# Patient Record
Sex: Male | Born: 1950 | Race: White | Hispanic: No | Marital: Married
Health system: Northeastern US, Academic
[De-identification: ages and names within clinical notes are randomized; demographics above are authoritative.]

---

## 2015-09-29 ENCOUNTER — Ambulatory Visit

## 2015-09-29 NOTE — Progress Notes (Signed)
* * *        **  Dale Reynolds    --- ---    33 Y old Male, DOB: 05-10-1950    [address] [place], [place], [place] [address]    Home: [phone]    Provider: Cherlyn Cushing        * * *    Telephone Encounter    ---    Answered by   Demetrios Loll  Date: 09/29/2015         Time: 04:28 PM    Reason   SG pt transition    --- ---            Message                      called no answer LVMTCB and reschedule.                Action Taken   Rodriguez,Jennifer 09/29/2015 4:28:39 PM > Rodriguez,Jennifer  10/02/2015 11:11:07 AM > 2nd attempt Rodriguez,Jennifer 10/03/2015 11:31:11 AM >  called again no answer should we send out a letter? Belleville,Lynne 10/03/2015  11:58:08 AM > pls send letter Tan,Molin 10/03/2015 1:29:23 PM > appt changed,  mailed out with letter                * * *                ---          * * *          Patient: Dale Reynolds DOB: March 05, 1951 Provider: Cherlyn Cushing  09/29/2015    ---    Note generated by eClinicalWorks EMR/PM Software ([email or web address])

## 2015-11-01 ENCOUNTER — Ambulatory Visit: Admitting: Urology

## 2015-11-01 NOTE — Progress Notes (Signed)
* * *        **  Dale Reynolds    --- ---    81 Y old Male, DOB: 1950-12-28    [address] [place], [place], [place] [address]    Home: [phone]    Provider: Aletta Edouard        * * *    Telephone Encounter    ---    Answered by   Jeani Hawking  Date: 11/01/2015         Time: 11:11 AM    Reason   new ordering md    --- ---            Action Taken   [facility] 11/01/2015 11:15:11 AM > new order in                * * *              * * *        ---        Reason for Appointment    ---      1\. New ordering md    ---      Current Medications    ---      None    ---      Past Medical History    ---      HTN    ---    Mitchondrial myopathy    ---    Nephrolithiasis    ---    BPH - Hypertrophy (benign) of prostate without urinary obstruction and other  lower urinary tract symptoms [LUTS]    ---    Kidney Stone - Calculus of kidney    ---    Hematuria, unspecified    ---    Elevated prostate specific antigen (PSA)    ---    Elevated prostate specific antigen (PSA)    ---    HTN (hypertension)    ---    Mitochondrial myopathy    ---      Assessments    ---    1\. Kidney stones - N20.0 (Primary)    ---      Treatment    ---       **1\. Kidney stones**    CT Abdomen and Pelvis C- C-ZYS*[id number]    ---          * * *          Patient: Dale Reynolds DOB: [birth year] Provider: Aletta Edouard 11/01/2015    ---    Note generated by eClinicalWorks EMR/PM Software ([email or web address])

## 2015-12-07 ENCOUNTER — Ambulatory Visit: Admitting: Ophthalmology

## 2015-12-07 NOTE — Op Note (Signed)
Patient    Xavien, Dauphinais              Med Rec #:  [id number]  Name:  Operation  12/07/2015                Pt.  Dt:                                  Location:  .  Marland Kitchen                               OPERATIVE REPORT  .  Marland Kitchen  PREOPERATIVE DIAGNOSIS:  Full thickness macular hole, left eye.  Marland Kitchen  POSTOPERATIVE DIAGNOSES:  Full thickness macular hole, left eye and retinal  tear, left eye.  Marland Kitchen  NAME OF OPERATION:  A 25-gauge pars plana vitrectomy, ICG assisted membrane  peeling, cryotherapy, 25% SF6.  Marland Kitchen  ATTENDING SURGEON:  Kandra Nicolas, M.D.  .  ASSISTANT SURGEON:  Odie Sera, M.D.  Please note that Dr. Odie Sera, was the first assistant, as no properly trained surgical resident  was available for the procedure.  .  ANESTHESIA:  Retrobulbar block with monitored anesthesia care.  .  COMPLICATIONS:  None.  .  ESTIMATED BLOOD LOSS:  None.  .  SPECIMENS:  None.  .  INDICATIONS FOR PROCEDURE:  This is a 65 year old male with decreased  vision secondary to macular hole.  Visual acuity prior to surgery was  20/200.  Risks, benefits, and alternatives of surgery were discussed at  length and all questions were answered.  The patient elected to proceed  with surgery and informed consent was obtained.  .  DESCRIPTION OF PROCEDURE:  The patient was brought to the preoperative  area.  The operative eye was marked as the correct surgical site and  premedicated with a standard regimen of dilating drops.  The patient was  brought to the operating room.  The operative eye was confirmed to be the  correct eye with the indirect ophthalmoscope and the nonoperative eye was  shielded.  Timeout was performed and a retrobulbar block was administered  to the operative eye in a standard fashion without complication.  The  patient was prepped and draped in the usual sterile fashion for ophthalmic  surgery.  An eyelid speculum was placed.  Three 25-gauge trocars were  inserted 4 mm posteriorly from the limbus inferotemporally,  supratemporally, and  supranasally.  The infusion line was secured at the  infratemporal cannula and turned on after the tip was verified to be in the  vitreous cavity.  Light pipe and vitrector were placed in the eye.  Intraoperative examination was significant for a folded and macular hole.  A core pars plana vitrectomy was performed.  A PVD was induced.  The  vitreous was cut for 360 degrees.  The ICG dye was used in the fluid filled  eye to stain the ILM.  All the ICG dye was removed.  Thereafter, the  Charles lens was used to visualize the macula.  The forceps were used to  create an edge and peel the internal limiting membrane of the macula.  At  this point, the periphery was inspected for 360 degrees and there were 2  areas of concern, both treated with cryotherapy, the first was a horseshoe  retinal tear at 11 o'clock and the second was a circular  retinal hole at 12  o'clock.  At this point, a complete air-fluid exchange was performed.  A  25% SF6 gas was then exchanged for air.  All the instruments were removed,  all the cannulas were removed and each wound was inspected and found to be  watertight.  The speculum was removed and drapes were removed.  Antibiotic  ointment was placed over the eye followed by double patch and shield.  The  patient was taken to recovery in good condition and will be seen in the  next following day in the [facility].  The attending surgeon, Dr. Kandra Nicolas was present during the entire procedure.  .  .  .  Electronically Signed  Kandra Nicolas, MD 12/08/2015 12:25 P  .  .  .  .  Dictated by: Su Monks, MD  .  D:    12/07/2015  T:    12/07/2015 04:29 P  Dictation ID:  [id number]/Doc#  [id number]  .  cc:  .  Marland Kitchen      Document is preliminary until electronically or manually signed by                             attending physician.

## 2015-12-08 ENCOUNTER — Ambulatory Visit (HOSPITAL_BASED_OUTPATIENT_CLINIC_OR_DEPARTMENT_OTHER)

## 2015-12-08 ENCOUNTER — Ambulatory Visit: Admitting: Ophthalmology

## 2015-12-14 ENCOUNTER — Ambulatory Visit

## 2015-12-14 ENCOUNTER — Ambulatory Visit: Admitting: Urology

## 2015-12-15 ENCOUNTER — Ambulatory Visit: Admitting: Internal Medicine

## 2015-12-15 ENCOUNTER — Ambulatory Visit (HOSPITAL_BASED_OUTPATIENT_CLINIC_OR_DEPARTMENT_OTHER)

## 2015-12-20 ENCOUNTER — Ambulatory Visit: Admitting: Urology

## 2015-12-20 NOTE — Progress Notes (Signed)
* * *        [name]    --- ---    46 Y old Male, DOB: 11/17/1950    Account Number: [id number]    [address] [name], [id number]    Home: [phone]    Guarantor: [name] Insurance: [employer] PPO Payer ID: [id number]    PCP: Rudene Anda, MD    Appointment Facility: [name]        * * *    12/20/2015  Progress Notes: [name]. [name], M.D.    --- ---    ---        Current Medications    ---    Taking     * Lisinopril Tablet 1 tablet Once a day    ---    * Flomax 0.4 MG Capsule 1 capsule 30 minutes after the same meal each day Once a day    ---    * Medication List reviewed and reconciled with the patient    ---      Past Medical History    ---      HTN    ---    Mitchondrial myopathy    ---    Nephrolithiasis    ---    BPH - Hypertrophy (benign) of prostate without urinary obstruction and other  lower urinary tract symptoms [LUTS]    ---    Hematuria, unspecified    ---    Elevated prostate specific antigen (PSA)    ---      Surgical History    ---      arthroscopic knee surgery    ---    appendectomy    ---    left eye surgery 12/07/15    ---    cystoscopy: c/w BPH 08/18/2012    ---      Family History    ---      Father: deceased, ALS    ---    Mother: deceased, diagnosed with Mental Illness    ---    Children: alive    ---    Siblings: alive    ---    1 brother(s) , 2 sister(s) - healthy. 2 son(s) .    ---    no prostate cancer in the family.    ---      Social History    ---    Alcohol: yes, social, weekends.    no Smoking status: former smoker quit: @2005 , Patient counseled on the dangers  of tobacco use: 12/20/2015.    Occupation: drives a truck.    no Recreational drug use.    Caffeine: yes, frequency:, coffee, tea, soda, 1-2 cups/day.      Allergies    ---      N.K.D.A.    ---      Review of Systems    ---     _General_ :    no Fatigue. no Fever. no Weakness.    _ENT_ :    no Blurred vision. no Epistaxis. no Ringing in ears. no Sinus pain.     _Endocrine_ :    no Excessive thirst. no dry mouth. no Thyroid disease. no Diabetes.    _Cardiovascular_ :    no High blood pressure. no Hyperlipidemia. no Atrial fibrillation. no Heart  valve disease. no Coronary disease. no Carotid Artery disease. no Peripheral  Artery disease. no Chest pain. no Palpitations.    _Pulmonary_ :    no cough.  no shortness of breath. no Lung disease (COPD), Asthma.    _Gastroenterology_ :    no Colitis, Crohn's or Ulcerative. no Blood in stool. no Constipation. no  Diarrhea.    _HematologyLymph_ :    no Bleeding disorders. no Easy bruising. no Swollen glands.    _Male Reproductive_ :    Sexually active yes. no Difficulty with erection. no Diminished sexual drive.    _Musculoskeletal_ :    no Back pain. no Joint pain. no bone pain. no Spinal or disk problems.    _Integumentary_ :    no Persistent itching. no Skin rash.    _Urology_ :    Kidney stones or cyst yes. no Abnormal Kidney function. no Blood in urine. no  Frequent urination. no Urinary incontinence.    _Neurology_ :    no Neuropathy. no Headache. no Insomnia. no Memory loss.    _Psychology_ :    no Anxiety. no Depression. no High stress level.            Reason for Appointment    ---      1\. Benign prostatic hyperplasia (BPH)    ---    2\. elevated Prostate specific antigen (PSA)    ---    3\. Kidney stones    ---      History of Present Illness    ---     _Urological History_ :    pt here with a Hx of BPH, an elevated PSA and kidney stones: A recent CT scan  showed a couple of punctate stones: pt is voiding well with the flomax; good  flow, good control, nocturia x 0-1; no hematuria, no dsyuria,.      Vital Signs    ---    Ht 6'0", Wt 199, BMI 26.99, BP 128/64, Temp 98.4.      Examination    ---     _General_ :    Lungs/Respirations: No labored breathing, good excursion. Musculoskeletal:  normal gait.    _Genitourinary - Male_ :    General appearance: alert, oriented, build: average, no apparent distress,  pleasant.  Abdomen: soft, non-tender, no mass, no cvat, non-distended.  Anus/Perineum: anus was intact, no lesions, no rashes, no fissures/fistulas.  Digital Rectal Exam (DRE): normal, no masses. Epididymis: normal, non-tender,  bilateral. Penis normal, no rashes. Prostate smooth, 1+, non-tender, no  nodules. Scrotum: normal, no rashes, no lesions. Sphincter tone: normal.  Testicles: not enlarged. non-tender, normal bilaterally. Urethral meatus:  normal, no discharge. HEENT: Head: normocephalic, Head: atraumatic, Sclera:  anicteric. Lymphatic: no inguinal adenopathy noted. Skin: good turgor.  Neurological: no focal sensory deficits, no focal motor deficits.          Assessments    ---    1\. Benign non-nodular prostatic hyperplasia with lower urinary tract symptoms  - N40.1 (Primary)    ---    2\. Elevated PSA - R97.2    ---    3\. Kidney stones - N20.0    ---    4\. Hematuria, microscopic - R31.2    ---      Treatment    ---       **1\. Benign non-nodular prostatic hyperplasia with lower urinary tract  symptoms**    Notes: He is voiding well and will stay on the Flomax and he knows to call me  if this changes.    ---        **2\. Elevated PSA**    Notes: His PSA is in the range that it  has been and unless it starts going up,  I would hold off on a biopsy for now.        **3\. Kidney stones**    Notes: His CT showed a couple of punctate stones bilaterally and thus there is  nothing to do except follow him and I would not get a CT yearly, but maybe an  [place] every other year to follow these unless there is a clinical issue. He knows  to hydrate well.        **4\. Hematuria, microscopic**    Notes: this is within normal limits today.        **5\. Others**    Continue Flomax Capsule, 0.4 MG, 1 capsule 30 minutes after the same meal each  day, Orally, Once a day    Notes: Patient Educated with: BPH.pdf (BPH.pdf).        Labs    --- ---    [name]: Urine Culture, Routine_    ---       colony count  0       --- --- --- ---    sent to ref  lab  no       --- --- --- ---         Sherlene Shams 12/21/2015 2:27:33 PM > This lab was reviewed by  [name] on 12/24/2015 at 22:04 PM EDT    --- ---    _Lab: ClinitekStatus SG_       Color  YELLOW     \-    --- --- --- ---    GLU  Negative     \-    --- --- --- ---    BIL  Negative     \-    --- --- --- ---    KET  Negative     \-    --- --- --- ---    SG  1.020     \-    --- --- --- ---    BLO  Large     \-    --- --- --- ---    pH  5.5     \-    --- --- --- ---    PRO  Negative     \-    --- --- --- ---    URO  0.2 E.U./dL     \-    --- --- --- ---    NIT  Negative     \-    --- --- --- ---    LEU  Trace     \-    --- --- --- ---         Marcaurelle,Krystle 12/20/2015 4:20:37 PM > Ishmeal Rorie E 12/20/2015  4:36:14 PM >    --- ---    [name]: Urine Microscopy_       WBC  0       --- --- --- ---    RBC  0-2       --- --- --- ---    Epithelial  0       --- --- --- ---    Bacteria  0       --- --- --- ---    Amorphous  trace       --- --- --- ---    Mucous  0       --- --- --- ---    Casts  0       --- --- --- ---    Other  0       --- --- --- ---  Marcaurelle,Krystle 2016/01/13 4:44:29 PM > Gerlean Cid E 01-13-16  4:45:17 PM > Shayona Hibbitts E January 13, 2016 4:45:25 PM >    --- ---    [name]: PSA, total_ 4.6 ng/ml       level  4.6 ng/ml       --- --- --- ---         Sherlene Shams 2016/01/13 4:44:07 PM > Cassandre Oleksy E 01/13/16  4:45:34 PM >    --- ---      Preventive Medicine    ---      Counseling: Stone Prevention Diet Counseled on increased fluid intake,  decreased salt intake 01/13/2016.    ---      Procedure Codes    ---      81003 UA Automated    ---    47829 UCCC    ---    56213 MICROSCOPIC EXAM OF URINE    ---    08657 PSA    ---      Follow Up    ---    1 Year    Electronically signed by [name] , MD on 12/24/2015 at 09:11 PM EDT    Sign off status: Completed        * * [facility]    [address]    [place], [place] [address]    Tel:  [phone]    Fax: [phone]              * * *          Patient: Lennex, Pietila DOB: 13-Dec-1950 Progress Note: [name]. [name], M.D.  January 13, 2016    ---    Note generated by eClinicalWorks EMR/PM Software ([email or web address])

## 2016-03-26 ENCOUNTER — Ambulatory Visit (HOSPITAL_BASED_OUTPATIENT_CLINIC_OR_DEPARTMENT_OTHER)

## 2016-12-27 ENCOUNTER — Ambulatory Visit: Admitting: Urology

## 2016-12-27 NOTE — Progress Notes (Signed)
* * *        **  Dale Reynolds    --- ---    45 Y old Male, DOB: 1951-04-04    [address] [place], [place], [place] [address]    Home: [phone]    Provider: Aletta Edouard        * * *    Telephone Encounter    ---    Answered by   Aletta Edouard  Date: 12/27/2016         Time: 12:21 PM    Action Taken   Catricia Scheerer E 12/27/2016 12:21:37 PM > he needs an appt: not  urgent Kawa,Diane 12/30/2016 9:19:43 AM > rescheduled appt to 9/14 spoke to pt    --- ---                * * *                ---          * * *          Patient: Dale Reynolds DOB: 06-Nov-1950 Provider: Aletta Edouard 12/27/2016    ---    Note generated by eClinicalWorks EMR/PM Software ([email or web address])

## 2016-12-27 NOTE — Progress Notes (Signed)
* * *        **  [name]    --- ---    32 Y old Male, DOB: Dec 22, 1950    Account Number: [id number]    [address] [name], [id number]    Home: [phone]    Guarantor: [name] Insurance: [employer] PPO Payer ID: [id number]    PCP: Rudene Anda, MD    Appointment Facility: [facility].        * * *    12/27/2016  Progress Notes: [name]. [name], M.D.    --- ---    ---        Current Medications    ---    Taking     * Lisinopril Tablet 1 tablet Orally Once a day    ---    * Flomax 0.4 MG Capsule 1 capsule 30 minutes after the same meal each day Orally Once a day    ---    * Tamsulosin HCl 0.4MG  Capsule TAKE ONE CAPSULE BY MOUTH 30 MINS AFTER THE SAME MEAL     ---      Past Medical History    ---       HTN.        ---    Mitchondrial myopathy.        ---    Nephrolithiasis.        ---    BPH - Hypertrophy (benign) of prostate without urinary obstruction and other  lower urinary tract symptoms [LUTS].        ---    Hematuria, unspecified.        ---    Elevated prostate specific antigen (PSA).        ---        Reason for Appointment    ---      1\. 1 yr follow up    ---    2\. Benign prostatic hyperplasia (BPH)    ---    3\. elevated Prostate specific antigen (PSA)    ---    4\. Kidney stones    ---      Treatment    ---       **1\. Others**    Notes: pt did not show.    ---    Electronically signed by [name] , MD on 12/27/2016 at 12:21 PM EDT    Sign off status: Completed        * * [facility]    [address]    [place], [place] [id number]    Tel: [phone]    Fax: [phone]              * * *          Patient: Windle, Huebert DOB: 06/19/1950 Progress Note: [name]. [name], M.D.  12/27/2016    ---    Note generated by eClinicalWorks EMR/PM Software ([email or web address])

## 2017-01-03 ENCOUNTER — Ambulatory Visit: Admitting: Urology

## 2017-01-03 NOTE — Progress Notes (Signed)
* * *        [name]    --- ---    58 Y old Male, DOB: 07-22-1950    Account Number: [id number]    [address] [name], [id number]    Home: [phone]    Guarantor: [name] Insurance: [employer] PPO Payer ID: [id number]    PCP: Rudene Anda, MD    Appointment Facility: [facility], [facility].        * * *    01/03/2017  Progress Notes: [name]. [name], M.D.    --- ---    ---        Current Medications    ---    Taking     * Lisinopril Tablet 1 tablet Orally Once a day    ---    * Flomax 0.4 MG Capsule 1 capsule 30 minutes after the same meal each day Orally Once a day    ---    * Tamsulosin HCl 0.4MG  Capsule TAKE ONE CAPSULE BY MOUTH 30 MINS AFTER THE SAME MEAL     ---      Past Medical History    ---       HTN.        ---    Mitchondrial myopathy.        ---    Nephrolithiasis.        ---    BPH - Hypertrophy (benign) of prostate without urinary obstruction and other  lower urinary tract symptoms [LUTS].        ---    Hematuria, unspecified.        ---    Elevated prostate specific antigen (PSA).        ---      Surgical History    ---      arthroscopic knee surgery    ---    appendectomy    ---    left eye surgery 12/07/15    ---    cystoscopy: c/w BPH 08/18/2012    ---      Family History    ---      Father: deceased, ALS    ---    Mother: deceased, diagnosed with Mental Illness    ---    Children: alive    ---    Siblings: alive    ---    1 brother(s) , 2 sister(s) - healthy. 2 son(s) .    ---    no prostate cancer in the family.    ---      Social History    ---    Alcohol: yes, social, weekends.    no Smoking status: former smoker quit: @2005 , Patient counseled on the dangers  of tobacco use: 01/03/2017.    Occupation: drives a truck.    no Recreational drug use.    Caffeine: yes, frequency:, coffee, tea, soda, 1-2 cups/day.      Allergies    ---      N.K.D.A.    ---      Review of Systems    ---     _General_ :    no Fever. no Chills. no Weakness. no Abdominal pain.     _Cardiovascular_ :    no Chest pain.    _Gastroenterology_ :    no Constipation. no Diarrhea.    _Urology_ :    no Blood in urine. no Frequent urination. no Urinary incontinence.  Reason for Appointment    ---      1\. Benign prostatic hyperplasia (BPH)    ---    2\. elevated Prostate specific antigen (PSA)    ---    3\. Kidney stones    ---      History of Present Illness    ---     _Urological History_ :    pt is voiding well: good flow, good control, nocturia x 0-1; no hematuria, no  dysuria: on the Flomax.      Vital Signs    ---    Ht 6'0", Wt 196.4, BMI 26.63, BP 122/60, Temp 98.4.      Examination    ---     _General_ :    Lungs/Respirations: No labored breathing, good excursion. Musculoskeletal:  normal gait.    _Genitourinary - Male_ :    General appearance: alert, oriented, build: average, no apparent distress,  pleasant. Abdomen: soft, non-tender, no mass, no cvat, non-distended.  Anus/Perineum: anus was intact, no lesions, no rashes, no fissures/fistulas.  Digital Rectal Exam (DRE): normal, no masses. Epididymis: normal, non-tender,  bilateral. Penis normal, no rashes. Prostate smooth, normal, non-tender,  slightly enlarged. Scrotum: no rashes, no lesions, normal. Sphincter tone:  normal. Testicles: not enlarged. non-tender, normal bilaterally. Urethral  meatus: normal, no discharge. HEENT: Head: normocephalic, Head: atraumatic,  Sclera: anicteric. Lymphatic: no inguinal adenopathy noted. Skin: good turgor.  Neurological: no focal sensory deficits, no focal motor deficits.          Assessments    ---    1\. Benign non-nodular prostatic hyperplasia with lower urinary tract symptoms  - N40.1 (Primary)    ---    2\. Kidney stones - N20.0    ---    3\. Elevated PSA - R97.2    ---      Treatment    ---       **1\. Benign non-nodular prostatic hyperplasia with lower urinary tract  symptoms**    Notes: he is voiding well on the Flomax and will continue and knows to call me  if his voiding changes.     ---        **2\. Kidney stones**    Notes: given his last CT will observe and consider a renal US in the future.  He knows to hydrate well.        **3\. Elevated PSA**    Notes: His PSA is stable so will continue to observe.        **4\. Others**    Continue Tamsulosin HCl Capsule, 0.4MG , TAKE ONE CAPSULE BY MOUTH 30 MINS  AFTER THE SAME MEAL    Notes: Patient Educated with: BPH.pdf (BPH.pdf).        Labs    --- ---    [name]: Urine Culture, Routine_    ---    _Lab: ClinitekStatus SG_       GLU  Negative     \-    --- --- --- ---    BIL  Negative     \-    --- --- --- ---    KET  Negative     \-    --- --- --- ---    SG  1.010     \-    --- --- --- ---    BLO  Moderate     \-    --- --- --- ---    pH  5.5     \-    --- --- --- ---  PRO  Negative     \-    --- --- --- ---    URO  0.2 E.U./dL     \-    --- --- --- ---    NIT  Negative     \-    --- --- --- ---    LEU  Trace     \-    --- --- --- ---         Sherlene Shams January 11, 2017 1:57:33 PM > Ski Polich E 2017/01/11  2:18:39 PM >    --- ---    [name]: Urine Microscopy_       WBC  0-1       --- --- --- ---    RBC  0-1       --- --- --- ---    Epithelial  0       --- --- --- ---    Bacteria  0       --- --- --- ---    Amorphous  trace       --- --- --- ---    Mucous  0       --- --- --- ---    Casts  0       --- --- --- ---    Other  0       --- --- --- ---         Sherlene Shams 01-11-2017 2:23:27 PM > Leeana Creer E Jan 11, 2017  2:28:06 PM >    --- ---    [name]: PSA, total_ 5.1 ng/ml       level  5.1 ng/ml       --- --- --- ---         Sherlene Shams 2017-01-11 2:22:54 PM > Jaremy Nosal E 2017/01/11  2:25:14 PM >    --- ---      Preventive Medicine    ---      Counseling: Stone Prevention Diet Counseled on increased fluid intake,  decreased salt intake January 11, 2017.    ---      Procedure Codes    ---      81003 UA Automated    ---    46962 UCCC    ---    95284 MICROSCOPIC EXAM OF URINE    ---    13244 PSA    ---      Follow Up    ---    1 Year     Electronically signed by [name] , MD on 01-11-2017 at 06:44 PM EDT    Sign off status: Completed        * * [facility]    [address]    [place], [place] [id number]    Tel: [phone]    Fax: [phone]              * * *          Patient: Burdette, Forehand DOB: 1950-06-19 Progress Note: [name]. [name], M.D.  2017-01-11    ---    Note generated by eClinicalWorks EMR/PM Software ([email or web address])

## 2017-01-06 ENCOUNTER — Ambulatory Visit

## 2017-01-08 ENCOUNTER — Ambulatory Visit: Admitting: Urology

## 2017-01-08 NOTE — Progress Notes (Signed)
* * *        Dale Reynolds    --- ---    10 Y old Male, DOB: [birth year]    [address] [place], [place], [place] [address]    Home: [phone]    Provider: Aletta Edouard        * * *    Telephone Encounter    ---    Answered by   Jaynie Crumble  Date: 01/08/2017         Time: 09:32 AM    Caller   pt    --- ---            Reason   Ct complete- f/u okay? Please advise            Message                      left side  thinks it his kidney stone  acting up   since yesterday    please all him @  [phone]                Action Taken   Kawa,Diane 01/08/2017 9:33:34 AM > Leahy,Nancy 01/08/2017 9:43:16  AM > no answer @ [phone] Leahy,Nancy 01/08/2017 11:30:24 AM > pt states  pain is on left- 11/2015 - RA said if stones acted up get an [place] - ok to order  Cohen,Matthew A 01/08/2017 11:42:57 AM > yes renal US thanks Leahy,Nancy  01/08/2017 12:09:15 PM > order is in- pt is off today until Friday - soonest  they can do for him - call pt with time and date Kawa,Diane 01/08/2017 12:38:19  PM > scheduled ultrasound @ [facility] 9/20 1:30 spoke to pt he is aware and will be  there Leahy,Nancy 01/08/2017 12:58:19 PM > pt has no stones and mild bilateral  hydro - they recommend a CT - maybe ask RE if we should order or let pt know  we will discuss with RA monday if not - lm at [phone] to call or call him  with update...thanks Edelstein,Robert A 01/10/2017 12:37:46 PM > OK to discuss  with Dr. [name] on Monday. THanks, RE [facility] 01/10/2017 1:43:42 PM >  Pt looking for [place] results Makinley Muscato E 01/12/2017 5:17:50 PM > we can get a  urolith CT scan [facility] 01/13/2017 10:22:08 AM > CT order is in = please  call pt and book Kawa,Diane 01/13/2017 11:10:10 AM > scheculed 9/27 @ 10:30 @  [facility] pt is aware Leahy,Nancy 01/13/2017 11:29:30 AM > can you get his  results and send this msg and results to myself /Larissa when ready  Phakonekham,Somchay 01/17/2017 8:07:15 AM > CT scan is in pt's doc  Winchester,Larissa 01/17/2017  11:24:06 AM > ct in pt docs- small punctuate  stones- had f/u 11/9- please let me lknow if he needs sooner, ty  Wallie Lagrand E 01/17/2017 11:29:33 AM > I called him this AM: see tel  encounter.Marland Kitchen all set [facility] 01/17/2017 11:47:05 AM > noted                * * *              * * *        ---        Reason for Appointment    ---      1\. Sever pain    ---         **Medical History:**   HTN.    ---  Mitchondrial myopathy.    ---    Nephrolithiasis.    ---    BPH - Hypertrophy (benign) of prostate without urinary obstruction and other  lower urinary tract symptoms [LUTS].    ---    Hematuria, unspecified.    ---    Elevated prostate specific antigen (PSA).    ---      Assessments    ---    1\. Kidney stones - N20.0 (Primary)    ---    2\. Acute left flank pain - R10.9    ---      Treatment    ---       **1\. Kidney stones**    _IMAGING: CT Abdomen and Pelvis Prone C- e-sch*_     scheduled at [facility] on 01/16/17 10:30 am Prior Auth # [id number]  (02/11/17) Kawa,Diane 01/13/2017 11:09:30 AM > Takeru Bose E 01/16/2017  6:25:47 PM >    --- ---    Ardine Bjork: [place] Retroperitoneal Complete e-sch*_   scheduled at [facility] on 01/09/17 1:30 pm MUST DRINK 32oz WATER ONE HOUR BEFORE AND HOLD  Kawa,Diane 01/08/2017 12:38:50 PM > Hildagarde Holleran E 01/13/2017 4:07:59 PM >    --- ---         **2\. Acute left flank pain**    _IMAGING: CT Abdomen and Pelvis Prone C- e-sch*_     scheduled at [facility] on 01/16/17 10:30 am Prior Auth # [id number]  (02/11/17) Kawa,Diane 01/13/2017 11:09:30 AM > Nakiyah Beverley E 01/16/2017  6:25:47 PM >    --- ---    Ardine Bjork: [place] Retroperitoneal Complete e-sch*_   scheduled at [facility] on 01/09/17 1:30 pm MUST DRINK 32oz WATER ONE HOUR BEFORE AND HOLD  Kawa,Diane 01/08/2017 12:38:50 PM > Donte Kary E 01/13/2017 4:07:59 PM >    --- ---          * * *          Patient: Dale Reynolds DOB: Sep 11, 1950 Provider: Aletta Edouard  01/08/2017    ---    Note generated by eClinicalWorks EMR/PM Software ([email or web address])

## 2017-01-09 ENCOUNTER — Ambulatory Visit: Admitting: Urology

## 2017-01-16 ENCOUNTER — Ambulatory Visit: Admitting: Urology

## 2017-01-16 ENCOUNTER — Ambulatory Visit

## 2017-01-17 ENCOUNTER — Ambulatory Visit: Admitting: Urology

## 2017-01-17 NOTE — Progress Notes (Signed)
* * *        **  [name]    --- ---    69 Y old Male, DOB: [birth year]    [address] [place], [place], [place] [address]    Home: [phone]    Provider: Aletta Edouard        * * *    Telephone Encounter    ---    Answered by   Aletta Edouard  Date: 01/17/2017         Time: 08:46 AM    Action Taken   Isobella Ascher E 01/17/2017 8:46:20 AM > I called the pt about  his CT scan. he is doing better but still with a little pain on /off so I will  see him in a month to make sure this small stone passed instead of being at  the UVJ. He knows to call us if his pain increases. : Appt made    --- ---                * * *                ---          * * *          Patient: Dale Reynolds, Dale Reynolds DOB: 1950-12-20 Provider: Aletta Edouard 01/17/2017    ---    Note generated by eClinicalWorks EMR/PM Software ([email or web address])

## 2017-02-28 ENCOUNTER — Ambulatory Visit

## 2017-02-28 ENCOUNTER — Ambulatory Visit: Admitting: Urology

## 2017-02-28 NOTE — Progress Notes (Signed)
* * *        Dale Reynolds    --- ---    26 Y old Male, DOB: 14-Dec-1950    Account Number: [id number]    [address] [name], [place]    Home: [phone]    Guarantor: Dale Reynolds Insurance: [employer] P[employer] Payer ID: [id number]    PCP: Rudene Anda, MD    Appointment Facility: [facility].        * * *    02/28/2017  Progress Notes: [name]. [name], M.D.    --- ---    ---        Current Medications    ---    Taking     * Lisinopril Tablet 1 tablet Orally Once a day    ---    * Flomax 0.4 MG Capsule 1 capsule 30 minutes after the same meal each day Orally Once a day    ---    * Tamsulosin HCl 0.4MG  Capsule TAKE ONE CAPSULE BY MOUTH 30 MINS AFTER THE SAME MEAL     ---      Past Medical History    ---       HTN.        ---    Mitchondrial myopathy.        ---    Nephrolithiasis.        ---    BPH - Hypertrophy (benign) of prostate without urinary obstruction and other  lower urinary tract symptoms [LUTS].        ---    Hematuria, unspecified.        ---    Elevated prostate specific antigen (PSA).        ---      Surgical History    ---      arthroscopic knee surgery    ---    appendectomy    ---    left eye surgery 12/07/15    ---    cystoscopy: c/w BPH 08/18/2012    ---      Family History    ---      Father: deceased, ALS    ---    Mother: deceased, diagnosed with Mental Illness    ---    Children: alive    ---    Siblings: alive    ---    1 brother(s) , 2 sister(s) - healthy. 2 son(s) .    ---    no prostate cancer in the family.    ---      Social History    ---    Alcohol: yes, social, weekends.    no Smoking status: former smoker quit: @2005 , Patient counseled on the dangers  of tobacco use: 02/28/2017.    Occupation: drives a truck.    no Recreational drug use.    Caffeine: yes, frequency:, coffee, tea, soda, 1-2 cups/day.      Allergies    ---      N.K.D.A.    ---      Hospitalization/Major Diagnostic Procedure    ---      No Hospitalization History.    ---       Review of Systems    ---     _General_ :    no Fever. no Weakness. no Abdominal pain. no Weight gain.    _Gastroenterology_ :    no Constipation. no Diarrhea.    _Musculoskeletal_ :    Back pain yes.  _Urology_ :    no Blood in urine. no Frequent urination. no Urinary incontinence.            Reason for Appointment    ---      1\. Kidney stones    ---      History of Present Illness    ---     _Urological History_ :    pt is here after a recent CT showed only small stones: but mild left  hydroureter with a recent small bladder stone. pt still has some left flank to  the middle of the back: and down, and some in rarely in the front; no voiding  S&S; at all; no gross hematuria, no dysuria. pain in the back relieved by  stretching out.. uncomfortable; advil helps:.      Examination    ---     _General_ :    Lungs/Respirations: good excursion, No labored breathing. Musculoskeletal:  normal gait.    _Genitourinary - Male_ :    General appearance: alert, oriented, build: average, no apparent distress,  pleasant. Abdomen: soft, non-tender, no mass, normal, non-distended, no cvat,  pt points to his mid lower back. Digital Rectal Exam (DRE): deferred.  Epididymis: normal, non-tender, bilateral. Penis no rashes, normal. Scrotum:  normal, no rashes, no lesions. Testicles: normal bilaterally, not enlarged.  non-tender. Urethral meatus: no discharge, normal. Lymphatic: no inguinal  adenopathy noted.          Assessments    ---    1\. Kidney stones - N20.0 (Primary)    ---    2\. Midline low back pain, unspecified chronicity, with sciatica presence  unspecified - M54.5    ---      Treatment    ---       **1\. Kidney stones**    _IMAGING: [place] Retroperitoneal Complete e-sch*_     see last CT scan: ? left  hydro and UVJ stone due to some eprsistent left back pain Booked @ [facility] 03/07/17 @ 1045AM Full Bladder 32oz Water 1Hr Prior Pre Reg  [phone] [name],[name] 02/28/2017 11:05:00 AM > faxed and gave hand order     --- ---        Notes: He probably passed his small stone but since the last CT scan showed  the stone possible at the UVJ as opposed to the bladder itself, I want to get  a follow up US on him to check on this. His S&S; are more c/w with actual back  pain and not stone discomfort or colic. I explained this to him using  pictures.        **2\. Midline low back pain, unspecified chronicity, with sciatica presence  unspecified**    Notes: I think this is back pain and if it gets worse to discuss with his PCP.        Labs    --- ---    [name]: ClinitekStatus SG_    ---       Color  yellow/clear     \-    --- --- --- ---    GLU  Negative     \-    --- --- --- ---    BIL  Negative     \-    --- --- --- ---    KET  Negative     \-    --- --- --- ---    SG  1.025     \-    --- --- --- ---  BLO  Large     \-    --- --- --- ---    pH  5.5     \-    --- --- --- ---    PRO  Negative     \-    --- --- --- ---    URO  0.2 E.U./dL     \-    --- --- --- ---    NIT  Negative     \-    --- --- --- ---    LEU  Negative     \-    --- --- --- ---         [name] 03-14-17 10:09:38 AM > [name] 2017-03-14 10:37:21 AM  >    --- ---    [name]: Urine Microscopy_       WBC  0       --- --- --- ---    RBC  0-1       --- --- --- ---    Epithelial  0       --- --- --- ---    Bacteria  0       --- --- --- ---    Amorphous  Trace       --- --- --- ---    Mucous  0       --- --- --- ---    Casts  0       --- --- --- ---    Other  0       --- --- --- ---         [name],[name] 2017/03/14 10:42:35 AM > [name] March 14, 2017 10:48:37 AM  >    --- ---      Preventive Medicine    ---      Counseling: Stone Prevention Diet Counseled on increased fluid intake,  decreased salt intake 14-Mar-2017.    ---      Procedure Codes    ---      29562 UA Automated    ---    13086 MICROSCOPIC EXAM OF URINE    ---      Follow Up    ---    as scheduled    Electronically signed by [name] , MD on 03/01/2017 at 07:50 AM EST    Sign off status: Completed         * * [facility]    [address]    [place], [place] [id number]    Tel: [phone]    Fax: [phone]              * * *          Patient: Heyward, Douthit DOB: 1950-07-04 Progress Note: [name]. [name], M.D.  2017/03/14    ---    Note generated by eClinicalWorks EMR/PM Software ([email or web address])

## 2017-03-07 ENCOUNTER — Ambulatory Visit: Admitting: Urology

## 2017-03-11 ENCOUNTER — Ambulatory Visit: Admitting: Urology

## 2017-03-11 NOTE — Progress Notes (Signed)
* * *        **  Dale Reynolds    --- ---    65 Y old Male, DOB: [birth year]    [address] [place], [place], [place] [address]    Home: [phone]    Provider: Aletta Edouard        * * *    Telephone Encounter    ---    Answered by   Aletta Edouard  Date: 03/11/2017         Time: 08:42 AM    Action Taken   Evanne Matsunaga E 03/11/2017 8:42:22 AM > I called the pt and  left him a message to call back about his [place] [name] E 03/12/2017  7:53:26 AM > I called again and left a specific message about his [place] and told  him to call me if he was having any clincial issues.    --- ---                * * *                ---          * * *          Patient: Dale Reynolds, Dale Reynolds DOB: 1950-07-17 Provider: Aletta Edouard 03/11/2017    ---    Note generated by eClinicalWorks EMR/PM Software ([email or web address])

## 2017-03-12 ENCOUNTER — Ambulatory Visit: Admitting: Urology

## 2017-03-12 NOTE — Progress Notes (Signed)
* * *        **  Dale Reynolds    --- ---    5 Y old Male, DOB: 06-15-1950    [address] [place], [place], [place] [address]    Home: [phone]    Provider: Aletta Edouard        * * *    Telephone Encounter    ---    Answered by   Alford Highland  Date: 03/12/2017         Time: 08:13 AM    Caller   self    --- ---            Reason   returning call            Message                      Patient had a few questions regarding your message - he can be reached on his cell [phone]                Action Taken   Hooton,Deborah 03/12/2017 8:13:18 AM > Adriaan Maltese E  03/12/2017 8:48:14 AM > I called him and we got cut off.. will try later  Lafe Clerk E 03/12/2017 8:52:35 AM > I spoke with him: he still has his  mid lower back pain that appears to be non GU in nature with no renal colic or  LUTS for now so will observe but he knows to call me with a change in pain or  voiding.                * * *                ---          * * *          Patient: Dale Reynolds, Dale Reynolds DOB: 04-14-1951 Provider: Aletta Edouard 03/12/2017    ---    Note generated by eClinicalWorks EMR/PM Software ([email or web address])

## 2017-03-23 ENCOUNTER — Ambulatory Visit: Admitting: Cardiovascular Disease

## 2017-03-23 NOTE — Progress Notes (Signed)
* * *        **  [name]**    --- ---    71 Y old Male, DOB: 09-Feb-1951    [address] [place] [address]    Home: [phone]    Provider: Mitchel Honour, MD        * * *    Web Encounter    ---    Answered by   Dorna Mai  Date: 03/23/2017         Time: 06:25 PM    Caller   [name]    --- ---            Reason   Update Demographics - Personal Info            Message                      Please Update Demographic Information                Action Taken                      [facility]  03/24/2017 12:17:22 PM > updated                    * * *                ---          * * *          Patient: KENAN, MOODIE D DOB: 1950-07-24 Provider: Mitchel Honour, MD  03/23/2017    ---    Note generated by eClinicalWorks EMR/PM Software ([email or web address])

## 2017-03-23 NOTE — Progress Notes (Signed)
* * *        **  Dale Reynolds**    --- ---    56 Y old Male, DOB: 10-17-1950    [address] [place] [address]    Home: [phone]    Provider: Mitchel Honour, MD        * * *    Web Encounter    ---    Answered by   Dorna Mai  Date: 03/23/2017         Time: 06:37 PM    Caller   [name]    --- ---            Reason   Update Demographics - Additional Info            Message                      Please Update Demographic Information                Action Taken                      [facility]  03/24/2017 12:17:39 PM > updated                    * * *                ---          * * *          Patient: Dale Reynolds, Dale Reynolds DOB: Sep 19, 1950 Provider: Mitchel Honour, MD  03/23/2017    ---    Note generated by eClinicalWorks EMR/PM Software ([email or web address])

## 2017-04-21 ENCOUNTER — Ambulatory Visit: Admitting: Urology

## 2017-07-10 ENCOUNTER — Ambulatory Visit

## 2017-08-07 ENCOUNTER — Ambulatory Visit: Admitting: Urology

## 2017-08-07 NOTE — Progress Notes (Signed)
**  PROGRESS NOTES**    ---    **Patient:** Dale Reynolds, Dale Reynolds     **Account Number:** [id number]   **Provider:** Shawntina Diffee E. [name], M.D.     **DOB:** 10-13-1950 **Age:** 29 Y **Sex:** Male   **Date:** 08/07/2017     **Phone:** [phone]     **Address:** [address] [place], [place], [id number]     **Pcp:** Rudene Anda, MD        * * *        **Subjective:**        ---       **Chief Complaints:**    --- ---         --- ---      **Medical History:**        --- ---        **Objective:**        ---         **Assessment:**        ---         **Plan:**        ---         --- ---          **Provider:** Sharie Amorin E. [name], M.D.    ---     **Patient:** Dale Reynolds, Dale Reynolds **DOB:** April 18, 1951 **Date:** 08/07/2017    ---    Electronically signed by Jeani Hawking on 08/07/2017 at 04:20 PM EDT    Sign off status: Completed

## 2017-08-15 ENCOUNTER — Ambulatory Visit: Admitting: Student in an Organized Health Care Education/Training Program

## 2017-08-15 LAB — HX BASIC METABOLIC PANEL
CASE NUMBER: 2019116000703
HX ANION GAP: 5 (ref 3.0–11.0)
HX BUN: 22 mg/dL (ref 8.0–23.0)
HX CALCIUM LVL: 8.7 mg/dL (ref 8.5–10.5)
HX CHLORIDE: 110 mmol/L (ref 98.0–110.0)
HX CO2: 29 mmol/L (ref 21.0–32.0)
HX CREATININE: 1.07 mg/dL (ref 0.55–1.3)
HX GLUCOSE LVL: 95 mg/dL (ref 70.0–110.0)
HX POTASSIUM LVL: 4.3 mmol/L (ref 3.6–5.2)
HX SODIUM LVL: 144 mmol/L (ref 136.0–146.0)

## 2017-08-15 LAB — HX CBC W/ INDICES
CASE NUMBER: 2019116000703
HX ABSOLUTE NRBC COUNT: 0 10*3/uL
HX HCT: 40.4 % (ref 39.0–53.0)
HX HGB: 13.4 g/dL (ref 13.0–17.5)
HX MCH: 31.9 pg (ref 26.0–34.0)
HX MCHC: 33.2 g/dL (ref 31.0–37.0)
HX MCV: 96.2 fL (ref 80.0–100.0)
HX MPV: 9.1 fL — ABNORMAL LOW (ref 9.4–12.4)
HX NRBC PERCENT: 0 %
HX PLATELET: 203 10*3/uL (ref 150.0–400.0)
HX RBC: 4.2 10*6/uL (ref 4.2–5.9)
HX RDW-CV: 13.6 % (ref 11.5–14.5)
HX RDW-SD: 48.5 fL (ref 35.0–51.0)
HX WBC: 6 10*3/uL (ref 4.0–11.0)

## 2017-08-15 LAB — HX GLOMERULAR FILTRATION RATE (ESTIMATED)
CASE NUMBER: 2019116000703
HX AFN AMER GLOMERULAR FILTRATION RATE: 82 mL/min/{1.73_m2}
HX NON-AFN AMER GLOMERULAR FILTRATION RATE: 71 mL/min/{1.73_m2}

## 2017-08-15 LAB — HX PSC EKG LAB: CASE NUMBER: 2019116000703

## 2017-08-20 ENCOUNTER — Ambulatory Visit

## 2017-08-20 NOTE — Procedures (Signed)
Pre-op Diagnosis    biliary colic    gallbladder adenomyomatosis    Post-op Diagnosis    biliary colic    gallbladder adenomyomatosis    Procedures    Date Procedure Modifier Comments    08/20/17 Cholecystectomy Laparoscopic  N/A      Case Attendees    Attendee Role    Judie Petit MD, Ardis Rowan. Anesthesiologist of Record    Orvan Falconer MD, [facility] Primary Surgeon    Dalpe RN, BSN, Evert Kohl First Assistant      Anesthesia Type    General    Margo Aye CRNA, Huntley Dec (Provider)    Estimated Blood Loss    7.0 mL    Transfusions    Transfusions    No qualifying data available.    Catheters, Tubes, Drains    Catheters, Drains, and Tubes    No qualifying data available.    Operative Implants    *** No implants in past 24 hours ***    Tourniquet    Operative Specimens    Date/Time Obtained Specimen Description Frozen Section Tests Requested    08/20/17 12:38:00 EDT gallbladder and contents No Pathology Tissue Exam  Request          Operative Fluids    - Parenteral in ml    - Drains in ml    - Urine Output in ml    Findings    normal appearing gallbladder    Complications    none    Discharge Status    pacu    home    OR Disposition    stable.        ------        SIGNATURE LINE Electronically signed by Orvan Falconer MD, Jadis Pitter on 08/20/2017 at  13:05:59 EST

## 2017-08-20 NOTE — H&P (Signed)
Health Status    I have seen and examined the patient and reviewed the current H and P        ---        SIGNATURE LINE Electronically signed by Orvan Falconer MD, Niaomi Cartaya on 08/20/2017 at  12:15:46 EST

## 2017-08-22 LAB — HX SURG PATH FINAL REPORT
CASE NUMBER: 0
HX NOTE: 88304

## 2018-01-02 ENCOUNTER — Ambulatory Visit: Admitting: Urology

## 2018-01-06 ENCOUNTER — Ambulatory Visit

## 2018-01-09 ENCOUNTER — Ambulatory Visit: Admitting: Urology

## 2018-01-09 NOTE — Progress Notes (Signed)
* * *        Dale Reynolds    --- ---    75 Y old Male, DOB: 1951/02/08    Account Number: [id number]    [address] [name], [place]    Home: [phone]    Guarantor: Dale Reynolds Insurance: [employer] Payer ID: [id number]    PCP: Rudene Anda, MD    Appointment Facility: [facility], [facility]        * * *    01/09/2018  Progress Notes: [name]. [name], M.D.    --- ---    ---         **Current Medications**    ---    Taking     * Lisinopril Tablet 1 tablet Orally Once a day    ---    * Flomax 0.4 MG Capsule 1 capsule 30 minutes after the same meal each day Orally Once a day    ---    * Tamsulosin HCl 0.4 MG Capsule TAKE ONE CAPSULE BY MOUTH AFTER THE SAME MEAL     ---    * Medication List reviewed and reconciled with the patient    ---      Past Medical History    ---       HTN.        ---    Mitchondrial myopathy.        ---    Nephrolithiasis.        ---    BPH - Hypertrophy (benign) of prostate without urinary obstruction and other  lower urinary tract symptoms [LUTS].        ---    Hematuria, unspecified.        ---    Elevated prostate specific antigen (PSA).        ---       **Surgical History**    ---       arthroscopic knee surgery    ---    appendectomy    ---    left eye surgery 12/07/15    ---    cystoscopy: c/w BPH 08/18/2012    ---    cholecystectomy 06/2017    ---       **Family History**    ---       Father: deceased, ALS    ---    Mother: deceased, diagnosed with Mental Illness    ---    Children: alive    ---    Siblings: alive    ---    1 brother(s) , 2 sister(s) - healthy. 2 son(s) .    ---    no prostate cancer in the family.    ---       **Social History**    ---    Alcohol: yes, social, weekends.    Caffeine: yes, frequency:, coffee, tea, soda, 1-2 cups/day.    no Smoking status: former smoker quit: @2005 , Patient counseled on the dangers  of tobacco use: 01/09/2018.    Occupation: drives a truck.    no Recreational drug use.      **Allergies**     ---       N.K.D.A.    ---       **Hospitalization/Major Diagnostic Procedure**    ---       No Hospitalization History.    ---       **Review of Systems**    ---     _General_ :  no flank pain. no Weakness. no Abdominal pain. no Weight gain.    _Cardiovascular_ :    no Chest pain.    _Gastroenterology_ :    no Constipation. no Diarrhea.    _Urology_ :    no Dysuria. no Blood in urine. no Frequent urination. no Urinary incontinence.            **Reason for Appointment**    ---       1\. 1 yr follow up    ---    2\. Kidney stones    ---    3\. Benign prostatic hyperplasia (BPH)    ---    4\. elevated Prostate specific antigen (PSA)    ---       **History of Present Illness**    ---     _Urological History_ :    Pt is voiding well on the flomax, good emptying, good control, good flow,  nocturia x 0-1; no dysuria, no hematuria, no flank pain.       **Vital Signs**    ---    Ht 6'0", Wt 185, BMI 25.09, BP 128/60, Temp 98.4.       **Examination**    ---     _General_ :    Lungs/Respirations:  No labored breathing, good excursion. Musculoskeletal:  normal gait.    _Genitourinary - Male_ :    General appearance:  alert, oriented, build: average, no apparent distress,  pleasant. Abdomen:  soft, non-tender, no mass, non-distended, no cvat.  Anus/Perineum:  anus was intact, no lesions, no rashes, no fissures/fistulas.  Digital Rectal Exam (DRE):  normal, no masses. Epididymis:  normal, non-  tender, bilateral. Penis  no rashes, normal. Prostate  smooth, normal, non-  tender, slightly enlarged, non-nodular. Scrotum:  no rashes, no lesions,  normal. Sphincter tone:  normal. Testicles:  not enlarged. non-tender, normal  bilaterally. Urethral meatus:  normal, no discharge. HEENT:  Head:  normocephalic, Head: atraumatic, Sclera: anicteric. Lymphatic:  no inguinal  adenopathy noted. Skin:  good turgor.          **Assessments**    ---    1\. Benign non-nodular prostatic hyperplasia with lower urinary tract symptoms  - N40.1     ---    2\. Kidney stones - N20.0    ---    3\. Elevated PSA - R97.2    ---       **Treatment**    ---       **1\. Benign non-nodular prostatic hyperplasia with lower urinary tract  symptoms**    Notes: He is voiding well so will continue the Flomax and he knows to call me  with any changes in voiding..    ---        **2\. Kidney stones**    Notes: Not a clinical issue at this time.        **3\. Elevated PSA**    Notes: We discussed this in length and discussed about the  reasons/risks/benefits of doing a TRUS/Prostate Bx vs. rechecking this in 4  months. because he has been up and down in the past and given his age, will  recheck this but he understands that if it remains high then he needs a  prostate Bx. .        **4\. Others**    Continue Flomax Capsule, 0.4 MG, 1 capsule 30 minutes after the same meal each  day, Orally, Once a day         **Labs**    --- ---  _Lab: ClinitekStatus SG_    ---       GLU  Negative     \-    --- --- --- ---    BIL  Negative     \-    --- --- --- ---    KET  Negative     \-    --- --- --- ---    SG  1.020     \-    --- --- --- ---    BLO  2+     \-    --- --- --- ---    pH  5.5     \-    --- --- --- ---    PRO  Negative     \-    --- --- --- ---    URO  0.2 E.U./dL     \-    --- --- --- ---    NIT  Negative     \-    --- --- --- ---    LEU  Negative     \-    --- --- --- ---         Marcaurelle,Krystle 01/09/2018 1:14:53 PM > Llana Deshazo E 01/09/2018  1:17:17 PM >    --- ---    [name]: Urine Microscopy_       WBC  0       --- --- --- ---    RBC  0-1       --- --- --- ---    Epithelial  0       --- --- --- ---    Bacteria  0       --- --- --- ---    Amorphous  trace       --- --- --- ---    Mucous  0       --- --- --- ---    Casts  0       --- --- --- ---    Other  0       --- --- --- ---         Marcaurelle,Krystle 01/09/2018 1:30:00 PM > Merwyn Hodapp E 01/09/2018  1:36:13 PM >    --- ---    [name]: PSA, total_ 7.4 ng/ml       level  7.4 ng/ml       --- --- --- ---          Sherlene Shams 01/09/2018 1:29:05 PM > Reta Norgren E 01/09/2018  1:31:32 PM >    --- ---        Procedure Orders:    --- ---    _Procedure: BLADDER SCAN_    ---         Phakonekham,Somchay 01/09/2018 1:27:30 PM > PVR approx 22cc    --- ---       **Preventive Medicine**    ---       Counseling: Stone Prevention Diet Counseled on increased fluid intake,  decreased salt intake 01/09/2018.    ---      **Procedure Codes**    ---       81003 UA Automated    ---    81015 MICROSCOPIC EXAM OF URINE    ---    [address] BLADDER SCAN    ---    60454 PSA    ---       **Follow Up**    ---    4 Months    Electronically signed by [name] , MD on 01/09/2018 at 08:48 PM  EDT    Sign off status: Completed        * * [facility]    [address]    [place], [place] [id number]    Tel: [phone]    Fax: [phone]              * * *          Patient: Dale, Reynolds DOB: [birth year] Progress Note: [name]. [name], M.D.  01/09/2018    ---    Note generated by eClinicalWorks EMR/PM Software ([email or web address])

## 2018-05-15 ENCOUNTER — Ambulatory Visit

## 2018-05-15 ENCOUNTER — Ambulatory Visit: Admitting: Urology

## 2018-05-15 NOTE — Progress Notes (Signed)
 * * *      Dale Reynolds**    ------    75 Y old Male, DOB: 1950-06-10    Account Number: [id number]    [address] [name], [place]    Home: [phone]    Guarantor: Dale Reynolds Insurance: [facility] Payer ID: [id number]    PCP: Rudene Anda, MD    Appointment Facility: [facility], [facility].        * * *    05/15/2018 Progress Notes: [name]. [name], M.D.    ------    ---       **Current Medications**    ---    Taking    * Lisinopril Tablet 1 tablet Orally Once a day    ---    * Tamsulosin HCl 0.4 MG Capsule TAKE ONE CAPSULE BY MOUTH AFTER THE SAME MEAL     ---    * Medication List reviewed and reconciled with the patient    ---     Past Medical History    ---      HTN.        ---    Mitchondrial myopathy.        ---    Nephrolithiasis.        ---    BPH - Hypertrophy (benign) of prostate without urinary obstruction and other  lower urinary tract symptoms [LUTS].        ---    Hematuria, unspecified.        ---    Elevated prostate specific antigen (PSA).        ---      **Surgical History**    ---      arthroscopic knee surgery    ---    appendectomy    ---    left eye surgery 12/07/15    ---    cystoscopy: c/w BPH 08/18/2012    ---    cholecystectomy 06/2017    ---      **Family History**    ---      Father: deceased, ALS    ---    Mother: deceased, diagnosed with Nonpsychotic mental disorder following  organic brain damage    ---    Children: alive    ---    Siblings: alive    ---    1 brother(s) , 2 sister(s) - healthy. 2 son(s) .    ---    no prostate cancer in the family.    ---      **Social History**    ---    no Recreational drug use.    no Smoking status: former smoker quit: @2005 , Patient counseled on the dangers  of tobacco use: 05/15/2018.    Alcohol: yes, social, weekends.    Occupation: drives a truck.    Caffeine: yes, frequency:, coffee, tea, soda, 1-2 cups/day.     **Allergies**    ---      N.K.D.A.    ---      **Review of Systems**     ---     _General_ :    no Chills. no Weakness. no Abdominal pain.    _Cardiovascular_ :    no Chest pain.    _Gastroenterology_ :    no Constipation. no Diarrhea.    _Urology_ :    no Dysuria. no Blood in urine. no Frequent urination. no Urinary incontinence.          **Reason for Appointment**    ---  1\. BPH    ---    2\. elevated Prostate specific antigen (PSA)    ---      **History of Present Illness**    ---     _Urological History_ :    last year some ED issues: get a partial erection not good enough for  penetration: tried Sildenafil: works not sure what dose:.    pt is voiding well and the Flomax helps: good flow good control, nocturia x  0-1; no issues.      **Vital Signs**    ---    Ht **6'0"** , Wt **196** , BMI **26.58** , BP **134/80** , Temp **98.2**.      **Examination**    ---     _General_ :    Lungs/Respirations:  No labored breathing, good excursion. Muscular  normal  gait.    _Genitourinary - Male_ :    General appearance:  alert, oriented, build: average, no apparent distress,  pleasant. Abdomen:  soft, non-tender, no mass, non-distended, no cvat, well  healed scar. Anus/Perineum:  anus was intact, no lesions, no rashes, no  fissures/fistulas. Digital Rectal Exam (DRE):  normal, no masses. Epididymis:  normal, non-tender, bilateral. Penis  normal, no rashes. Prostate  1+, non-  tender, no nodules, smooth. Scrotum:  normal, no rashes, no lesions. Sphincter  tone:  normal. Testicles: normal bilaterally, not enlarged. non-tender.  Urethral meatus:  normal, no discharge. HEENT:  Head: normocephalic, Head:  atraumatic, Sclera: anicteric. Lymphatic:  no inguinal adenopathy noted. Skin:  good turgor.         **Assessments**    ---    1\. Benign non-nodular prostatic hyperplasia with lower urinary tract symptoms  - N40.1 (Primary)    ---    2\. Kidney stones - N20.0    ---    3\. Hematuria, microscopic - R31.2    ---    4\. Vasculogenic erectile dysfunction, unspecified vasculogenic  erectile  dysfunction type - N52.9    ---    5\. Elevated PSA - R97.2    ---      **Treatment**    ---      **1\. Benign non-nodular prostatic hyperplasia with lower urinary tract  symptoms**    Notes: He is voiding well on the Flomax and will continue and call me if this  changes.    ---        **2\. Kidney stones**    Notes: We can recheck an [place] in a year.        **3\. Hematuria, microscopic**    Notes: His UA was within normal limits.        **4\. Vasculogenic erectile dysfunction, unspecified vasculogenic erectile  dysfunction type**    Notes: Discussed with pt at length about the multifactorial causes of ED  including psychological causes, hormonal issues, neurological causes and  vascular causes. He is on sildenafil ( he does not know the dose and it works  for him and I told him to let us know the dose and I also told him not to take  to close to the Flomax and that if he ever gets chest pain after taking one,  he needs to let the EMS people know so not to give him any nitrates. His  libido is good and does not need a T level checked.        **5\. Elevated PSA**    Notes: His PSA is back down to the range that he has been in the past several  years so can observe. .      **Labs**    ------    _Lab: ClinitekStatus SG_    ---      GLU Negative   \-    ------------    BIL Negative   \-    ------------    KET Trace   \-    ------------    SG 1.020   \-    ------------    BLO 1+   \-    ------------    pH 5.5   \-    ------------    PRO Negative   \-    ------------    URO 0.2 E.U./dL   \-    ------------    NIT Negative   \-    ------------    LEU Negative   \-    ------------       Marcaurelle,Krystle 05/15/2018 8:08:47 AM > Melodye Swor E 05/15/2018  8:12:04 AM >    ------    _Lab: Urine Microscopy_    _Lab: PSA, total_ 5.4 ng/ml      level 5.4 ng/ml     ------------       [facility] 05/15/2018 8:25:19 AM > Aishani Kalis E  05/15/2018  8:28:45 AM >    ------      Procedure Orders:    ------    _Procedure: BLADDER SCAN_    ---       Phakonekham,Somchay 05/15/2018 8:17:16 AM > PVR approx 32cc    ------      **Procedure Codes**    ---      81003 UA Automated    ---    81015 MICROSCOPIC EXAM OF URINE    ---    13244 BLADDER SCAN    ---    01027 PSA    ---      **Follow Up**    ---    6 Months    Electronically signed by [name] , MD on 05/15/2018 at 09:32 AM EST    Sign off status: Completed        * * [facility]    [address]    [place], [place] [id number]    Tel: [phone]    Fax: [phone]              * * *         Patient: Dale Reynolds, Dale Reynolds DOB: [birth year] Progress Note: [name]. [name], M.D.  05/15/2018    ---    Note generated by eClinicalWorks EMR/PM Software ([email or web address])

## 2018-07-30 ENCOUNTER — Ambulatory Visit: Admitting: Urology

## 2018-07-30 NOTE — Progress Notes (Signed)
* * *      **  Dale Reynolds**    ------    28 Y old Male, DOB: 1950/08/15    [address] [place], [place], [place] [address]    Home: [phone]    Provider: Aletta Edouard        * * *    Telephone Encounter    ---    Answered by  Jeani Hawking Date: 07/30/2018       Time: 02:29 PM    Reason  FYI only    ------            Action Taken                     [facility]  07/30/2018 2:29:56 PM > Refilled flomax                Refills Refill Tamsulosin HCl Capsule, 0.4 MG, Orally, 90 Capsule, 1 capsule,  Once a day, 90 days, Refills=2    ------          * * *                ---          * * *         PatientLorelle Reynolds DOB: [birth year] Provider: Aletta Edouard 07/30/2018    ---    Note generated by eClinicalWorks EMR/PM Software ([email or web address])

## 2018-09-25 ENCOUNTER — Ambulatory Visit: Admitting: Urology

## 2018-11-14 ENCOUNTER — Ambulatory Visit (HOSPITAL_BASED_OUTPATIENT_CLINIC_OR_DEPARTMENT_OTHER)

## 2018-11-16 ENCOUNTER — Ambulatory Visit: Admitting: Urology

## 2018-11-16 NOTE — Progress Notes (Signed)
* * *      BAUER, AUSBORN **DOB:** [birth year] (68 yo M) **Acc No.** [id number] **DOS:**  11/16/2018    ---       [name]**    ------    66 Y old Male, DOB: 1951-04-21    [address] [place], [place], [place] [address]    Home: [phone]    Provider: Aletta Edouard        * * *    Telephone Encounter    ---    Answered by  Chalmers Cater Date: 11/16/2018       Time: 10:05 AM    Caller  pt    ------            Reason  cx 7/31 appt            Message                     pt cx appt and doesnt want to rs, pt moved out of area                 Action Taken                     Ponte,Mackenzie  11/16/2018 10:05:38 AM >      Kawa,Diane  11/16/2018 10:54:53 AM > Dr Currie Paris                    * * *                ---          * * *         Provider: Aletta Edouard 11/16/2018    ---    Note generated by eClinicalWorks EMR/PM Software ([email or web address])

## 2020-07-18 NOTE — Progress Notes (Signed)
* * *        **  [name]**    --- ---    56 Y old Male, DOB: 10-17-1950    [address] [place] [address]    Home: [phone]    Provider: Mitchel Honour, MD        * * *    Web Encounter    ---    Answered by   Dorna Mai  Date: 03/23/2017         Time: 06:37 PM    Caller   [name]    --- ---            Reason   Update Demographics - Additional Info            Message                      Please Update Demographic Information                Action Taken                      [facility]  03/24/2017 12:17:39 PM > updated                    * * *                ---          * * *          Patient: Dale Reynolds, Dale Reynolds DOB: Sep 19, 1950 Provider: Mitchel Honour, MD  03/23/2017    ---    Note generated by eClinicalWorks EMR/PM Software ([email or web address])

## 2020-07-18 NOTE — Progress Notes (Signed)
* * *        **  Dale Reynolds    --- ---    81 Y old Male, DOB: 1950-12-28    [address] [place], [place], [place] [address]    Home: [phone]    Provider: Aletta Edouard        * * *    Telephone Encounter    ---    Answered by   Jeani Hawking  Date: 11/01/2015         Time: 11:11 AM    Reason   new ordering md    --- ---            Action Taken   [facility] 11/01/2015 11:15:11 AM > new order in                * * *              * * *        ---        Reason for Appointment    ---      1\. New ordering md    ---      Current Medications    ---      None    ---      Past Medical History    ---      HTN    ---    Mitchondrial myopathy    ---    Nephrolithiasis    ---    BPH - Hypertrophy (benign) of prostate without urinary obstruction and other  lower urinary tract symptoms [LUTS]    ---    Kidney Stone - Calculus of kidney    ---    Hematuria, unspecified    ---    Elevated prostate specific antigen (PSA)    ---    Elevated prostate specific antigen (PSA)    ---    HTN (hypertension)    ---    Mitochondrial myopathy    ---      Assessments    ---    1\. Kidney stones - N20.0 (Primary)    ---      Treatment    ---       **1\. Kidney stones**    CT Abdomen and Pelvis C- C-ZYS*[id number]    ---          * * *          Patient: Dale Reynolds DOB: [birth year] Provider: Aletta Edouard 11/01/2015    ---    Note generated by eClinicalWorks EMR/PM Software ([email or web address])

## 2020-07-18 NOTE — Progress Notes (Signed)
* * *        Dale Reynolds    --- ---    46 Y old Male, DOB: 11/17/1950    Account Number: [id number]    [address] [name], [id number]    Home: [phone]    Guarantor: Dale Reynolds Insurance: [employer] PPO Payer ID: [id number]    PCP: Dale Anda, MD    Appointment Facility: [name]        * * *    12/20/2015  Progress Notes: [name]. [name], M.D.    --- ---    ---        Current Medications    ---    Taking     * Lisinopril Tablet 1 tablet Once a day    ---    * Flomax 0.4 MG Capsule 1 capsule 30 minutes after the same meal each day Once a day    ---    * Medication List reviewed and reconciled with the patient    ---      Past Medical History    ---      HTN    ---    Mitchondrial myopathy    ---    Nephrolithiasis    ---    BPH - Hypertrophy (benign) of prostate without urinary obstruction and other  lower urinary tract symptoms [LUTS]    ---    Hematuria, unspecified    ---    Elevated prostate specific antigen (PSA)    ---      Surgical History    ---      arthroscopic knee surgery    ---    appendectomy    ---    left eye surgery 12/07/15    ---    cystoscopy: c/w BPH 08/18/2012    ---      Family History    ---      Father: deceased, ALS    ---    Mother: deceased, diagnosed with Mental Illness    ---    Children: alive    ---    Siblings: alive    ---    1 brother(s) , 2 sister(s) - healthy. 2 son(s) .    ---    no prostate cancer in the family.    ---      Social History    ---    Alcohol: yes, social, weekends.    no Smoking status: former smoker quit: @2005 , Patient counseled on the dangers  of tobacco use: 12/20/2015.    Occupation: drives a truck.    no Recreational drug use.    Caffeine: yes, frequency:, coffee, tea, soda, 1-2 cups/day.      Allergies    ---      N.K.D.A.    ---      Review of Systems    ---     _General_ :    no Fatigue. no Fever. no Weakness.    _ENT_ :    no Blurred vision. no Epistaxis. no Ringing in ears. no Sinus pain.     _Endocrine_ :    no Excessive thirst. no dry mouth. no Thyroid disease. no Diabetes.    _Cardiovascular_ :    no High blood pressure. no Hyperlipidemia. no Atrial fibrillation. no Heart  valve disease. no Coronary disease. no Carotid Artery disease. no Peripheral  Artery disease. no Chest pain. no Palpitations.    _Pulmonary_ :    no cough.  no shortness of breath. no Lung disease (COPD), Asthma.    _Gastroenterology_ :    no Colitis, Crohn's or Ulcerative. no Blood in stool. no Constipation. no  Diarrhea.    _HematologyLymph_ :    no Bleeding disorders. no Easy bruising. no Swollen glands.    _Male Reproductive_ :    Sexually active yes. no Difficulty with erection. no Diminished sexual drive.    _Musculoskeletal_ :    no Back pain. no Joint pain. no bone pain. no Spinal or disk problems.    _Integumentary_ :    no Persistent itching. no Skin rash.    _Urology_ :    Kidney stones or cyst yes. no Abnormal Kidney function. no Blood in urine. no  Frequent urination. no Urinary incontinence.    _Neurology_ :    no Neuropathy. no Headache. no Insomnia. no Memory loss.    _Psychology_ :    no Anxiety. no Depression. no High stress level.            Reason for Appointment    ---      1\. Benign prostatic hyperplasia (BPH)    ---    2\. elevated Prostate specific antigen (PSA)    ---    3\. Kidney stones    ---      History of Present Illness    ---     _Urological History_ :    pt here with a Hx of BPH, an elevated PSA and kidney stones: A recent CT scan  showed a couple of punctate stones: pt is voiding well with the flomax; good  flow, good control, nocturia x 0-1; no hematuria, no dsyuria,.      Vital Signs    ---    Ht 6'0", Wt 199, BMI 26.99, BP 128/64, Temp 98.4.      Examination    ---     _General_ :    Lungs/Respirations: No labored breathing, good excursion. Musculoskeletal:  normal gait.    _Genitourinary - Male_ :    General appearance: alert, oriented, build: average, no apparent distress,  pleasant.  Abdomen: soft, non-tender, no mass, no cvat, non-distended.  Anus/Perineum: anus was intact, no lesions, no rashes, no fissures/fistulas.  Digital Rectal Exam (DRE): normal, no masses. Epididymis: normal, non-tender,  bilateral. Penis normal, no rashes. Prostate smooth, 1+, non-tender, no  nodules. Scrotum: normal, no rashes, no lesions. Sphincter tone: normal.  Testicles: not enlarged. non-tender, normal bilaterally. Urethral meatus:  normal, no discharge. HEENT: Head: normocephalic, Head: atraumatic, Sclera:  anicteric. Lymphatic: no inguinal adenopathy noted. Skin: good turgor.  Neurological: no focal sensory deficits, no focal motor deficits.          Assessments    ---    1\. Benign non-nodular prostatic hyperplasia with lower urinary tract symptoms  - N40.1 (Primary)    ---    2\. Elevated PSA - R97.2    ---    3\. Kidney stones - N20.0    ---    4\. Hematuria, microscopic - R31.2    ---      Treatment    ---       **1\. Benign non-nodular prostatic hyperplasia with lower urinary tract  symptoms**    Notes: He is voiding well and will stay on the Flomax and he knows to call me  if this changes.    ---        **2\. Elevated PSA**    Notes: His PSA is in the range that it  has been and unless it starts going up,  I would hold off on a biopsy for now.        **3\. Kidney stones**    Notes: His CT showed a couple of punctate stones bilaterally and thus there is  nothing to do except follow him and I would not get a CT yearly, but maybe an  [place] every other year to follow these unless there is a clinical issue. He knows  to hydrate well.        **4\. Hematuria, microscopic**    Notes: this is within normal limits today.        **5\. Others**    Continue Flomax Capsule, 0.4 MG, 1 capsule 30 minutes after the same meal each  day, Orally, Once a day    Notes: Patient Educated with: BPH.pdf (BPH.pdf).        Labs    --- ---    [name]: Urine Culture, Routine_    ---       colony count  0       --- --- --- ---    sent to ref  lab  no       --- --- --- ---         Sherlene Shams 12/21/2015 2:27:33 PM > This lab was reviewed by  [name] on 12/24/2015 at 22:04 PM EDT    --- ---    _Lab: ClinitekStatus SG_       Color  YELLOW     \-    --- --- --- ---    GLU  Negative     \-    --- --- --- ---    BIL  Negative     \-    --- --- --- ---    KET  Negative     \-    --- --- --- ---    SG  1.020     \-    --- --- --- ---    BLO  Large     \-    --- --- --- ---    pH  5.5     \-    --- --- --- ---    PRO  Negative     \-    --- --- --- ---    URO  0.2 E.U./dL     \-    --- --- --- ---    NIT  Negative     \-    --- --- --- ---    LEU  Trace     \-    --- --- --- ---         Marcaurelle,Krystle 12/20/2015 4:20:37 PM > Ishmeal Rorie E 12/20/2015  4:36:14 PM >    --- ---    [name]: Urine Microscopy_       WBC  0       --- --- --- ---    RBC  0-2       --- --- --- ---    Epithelial  0       --- --- --- ---    Bacteria  0       --- --- --- ---    Amorphous  trace       --- --- --- ---    Mucous  0       --- --- --- ---    Casts  0       --- --- --- ---    Other  0       --- --- --- ---  Marcaurelle,Krystle 2016/01/13 4:44:29 PM > Gerlean Cid E 01-13-16  4:45:17 PM > Shayona Hibbitts E January 13, 2016 4:45:25 PM >    --- ---    [name]: PSA, total_ 4.6 ng/ml       level  4.6 ng/ml       --- --- --- ---         Sherlene Shams 2016/01/13 4:44:07 PM > Cassandre Oleksy E 01/13/16  4:45:34 PM >    --- ---      Preventive Medicine    ---      Counseling: Stone Prevention Diet Counseled on increased fluid intake,  decreased salt intake 01/13/2016.    ---      Procedure Codes    ---      81003 UA Automated    ---    47829 UCCC    ---    56213 MICROSCOPIC EXAM OF URINE    ---    08657 PSA    ---      Follow Up    ---    1 Year    Electronically signed by [name] , MD on 12/24/2015 at 09:11 PM EDT    Sign off status: Completed        * * [facility]    [address]    [place], [place] [id number]    Tel:  [phone]    Fax: [phone]              * * *          Patient: Dale Reynolds, Dale Reynolds DOB: 13-Dec-1950 Progress Note: [name]. [name], M.D.  January 13, 2016    ---    Note generated by eClinicalWorks EMR/PM Software ([email or web address])

## 2020-07-18 NOTE — Progress Notes (Signed)
* * *        **  Dale Reynolds    --- ---    65 Y old Male, DOB: [birth year]    [address] [place], [place], [place] [address]    Home: [phone]    Provider: Aletta Edouard        * * *    Telephone Encounter    ---    Answered by   Aletta Edouard  Date: 03/11/2017         Time: 08:42 AM    Action Taken   Evanne Matsunaga E 03/11/2017 8:42:22 AM > I called the pt and  left him a message to call back about his [place] [name] E 03/12/2017  7:53:26 AM > I called again and left a specific message about his [place] and told  him to call me if he was having any clincial issues.    --- ---                * * *                ---          * * *          Patient: Dale Reynolds, Dale Reynolds DOB: 1950-07-17 Provider: Aletta Edouard 03/11/2017    ---    Note generated by eClinicalWorks EMR/PM Software ([email or web address])

## 2020-07-18 NOTE — Progress Notes (Signed)
* * *        **  Dale Reynolds    --- ---    33 Y old Male, DOB: 05-10-1950    [address] [place], [place], [place] [address]    Home: [phone]    Provider: Cherlyn Cushing        * * *    Telephone Encounter    ---    Answered by   Demetrios Loll  Date: 09/29/2015         Time: 04:28 PM    Reason   SG pt transition    --- ---            Message                      called no answer LVMTCB and reschedule.                Action Taken   Rodriguez,Jennifer 09/29/2015 4:28:39 PM > Rodriguez,Jennifer  10/02/2015 11:11:07 AM > 2nd attempt Rodriguez,Jennifer 10/03/2015 11:31:11 AM >  called again no answer should we send out a letter? Belleville,Lynne 10/03/2015  11:58:08 AM > pls send letter Tan,Molin 10/03/2015 1:29:23 PM > appt changed,  mailed out with letter                * * *                ---          * * *          Patient: Dale Reynolds DOB: March 05, 1951 Provider: Cherlyn Cushing  09/29/2015    ---    Note generated by eClinicalWorks EMR/PM Software ([email or web address])

## 2020-07-18 NOTE — Progress Notes (Signed)
* * *        Dale Reynolds    --- ---    26 Y old Male, DOB: 14-Dec-1950    Account Number: [id number]    [address] [name], [id number]    Home: [phone]    Guarantor: Dale Reynolds Insurance: [employer] PPO Payer ID: [id number]    PCP: Dale Anda, MD    Appointment Facility: [facility].        * * *    02/28/2017  Progress Notes: Dale Reynolds. Dale Reynolds, M.D.    --- ---    ---        Current Medications    ---    Taking     * Lisinopril Tablet 1 tablet Orally Once a day    ---    * Flomax 0.4 MG Capsule 1 capsule 30 minutes after the same meal each day Orally Once a day    ---    * Tamsulosin HCl 0.4MG  Capsule TAKE ONE CAPSULE BY MOUTH 30 MINS AFTER THE SAME MEAL     ---      Past Medical History    ---       HTN.        ---    Mitchondrial myopathy.        ---    Nephrolithiasis.        ---    BPH - Hypertrophy (benign) of prostate without urinary obstruction and other  lower urinary tract symptoms [LUTS].        ---    Hematuria, unspecified.        ---    Elevated prostate specific antigen (PSA).        ---      Surgical History    ---      arthroscopic knee surgery    ---    appendectomy    ---    left eye surgery 12/07/15    ---    cystoscopy: c/w BPH 08/18/2012    ---      Family History    ---      Father: deceased, ALS    ---    Mother: deceased, diagnosed with Mental Illness    ---    Children: alive    ---    Siblings: alive    ---    1 brother(s) , 2 sister(s) - healthy. 2 son(s) .    ---    no prostate cancer in the family.    ---      Social History    ---    Alcohol: yes, social, weekends.    no Smoking status: former smoker quit: @2005 , Patient counseled on the dangers  of tobacco use: 02/28/2017.    Occupation: drives a truck.    no Recreational drug use.    Caffeine: yes, frequency:, coffee, tea, soda, 1-2 cups/day.      Allergies    ---      N.K.D.A.    ---      Hospitalization/Major Diagnostic Procedure    ---      No Hospitalization History.    ---       Review of Systems    ---     _General_ :    no Fever. no Weakness. no Abdominal pain. no Weight gain.    _Gastroenterology_ :    no Constipation. no Diarrhea.    _Musculoskeletal_ :    Back pain yes.  _Urology_ :    no Blood in urine. no Frequent urination. no Urinary incontinence.            Reason for Appointment    ---      1\. Kidney stones    ---      History of Present Illness    ---     _Urological History_ :    pt is here after a recent CT showed only small stones: but mild left  hydroureter with a recent small bladder stone. pt still has some left flank to  the middle of the back: and down, and some in rarely in the front; no voiding  S&S; at all; no gross hematuria, no dysuria. pain in the back relieved by  stretching out.. uncomfortable; advil helps:.      Examination    ---     _General_ :    Lungs/Respirations: good excursion, No labored breathing. Musculoskeletal:  normal gait.    _Genitourinary - Male_ :    General appearance: alert, oriented, build: average, no apparent distress,  pleasant. Abdomen: soft, non-tender, no mass, normal, non-distended, no cvat,  pt points to his mid lower back. Digital Rectal Exam (DRE): deferred.  Epididymis: normal, non-tender, bilateral. Penis no rashes, normal. Scrotum:  normal, no rashes, no lesions. Testicles: normal bilaterally, not enlarged.  non-tender. Urethral meatus: no discharge, normal. Lymphatic: no inguinal  adenopathy noted.          Assessments    ---    1\. Kidney stones - N20.0 (Primary)    ---    2\. Midline low back pain, unspecified chronicity, with sciatica presence  unspecified - M54.5    ---      Treatment    ---       **1\. Kidney stones**    _IMAGING: [place] Retroperitoneal Complete Reynolds-sch*_     see last CT scan: ? left  hydro and UVJ stone due to some eprsistent left back pain Booked @ [facility] 03/07/17 @ 1045AM Full Bladder 32oz Water 1Hr Prior Pre Reg  [phone] Dale Reynolds 02/28/2017 11:05:00 AM > faxed and gave hand order     --- ---        Notes: He probably passed his small stone but since the last CT scan showed  the stone possible at the UVJ as opposed to the bladder itself, I want to get  a follow up US on him to check on this. His S&S; are more c/w with actual back  pain and not stone discomfort or colic. I explained this to him using  pictures.        **2\. Midline low back pain, unspecified chronicity, with sciatica presence  unspecified**    Notes: I think this is back pain and if it gets worse to discuss with his PCP.        Labs    --- ---    Dale Reynolds: ClinitekStatus SG_    ---       Color  yellow/clear     \-    --- --- --- ---    GLU  Negative     \-    --- --- --- ---    BIL  Negative     \-    --- --- --- ---    KET  Negative     \-    --- --- --- ---    SG  1.025     \-    --- --- --- ---  BLO  Large     \-    --- --- --- ---    pH  5.5     \-    --- --- --- ---    PRO  Negative     \-    --- --- --- ---    URO  0.2 Reynolds.U./dL     \-    --- --- --- ---    NIT  Negative     \-    --- --- --- ---    LEU  Negative     \-    --- --- --- ---         Dale Reynolds 03-14-17 10:09:38 AM > Dale Reynolds 2017-03-14 10:37:21 AM  >    --- ---    Dale Reynolds: Urine Microscopy_       WBC  0       --- --- --- ---    RBC  0-1       --- --- --- ---    Epithelial  0       --- --- --- ---    Bacteria  0       --- --- --- ---    Amorphous  Trace       --- --- --- ---    Mucous  0       --- --- --- ---    Casts  0       --- --- --- ---    Other  0       --- --- --- ---         Dale Reynolds 2017/03/14 10:42:35 AM > Dale Reynolds March 14, 2017 10:48:37 AM  >    --- ---      Preventive Medicine    ---      Counseling: Stone Prevention Diet Counseled on increased fluid intake,  decreased salt intake 14-Mar-2017.    ---      Procedure Codes    ---      29562 UA Automated    ---    13086 MICROSCOPIC EXAM OF URINE    ---      Follow Up    ---    as scheduled    Electronically signed by Dale Reynolds , MD on 03/01/2017 at 07:50 AM EST    Sign off status: Completed         * * [facility]    [address]    [place], [place] [address]    Tel: [phone]    Fax: [phone]              * * *          Patient: Dale Reynolds, Dale Reynolds DOB: 1950-07-04 Progress Note: Dale Reynolds. Dale Reynolds, M.D.  2017/03/14    ---    Note generated by eClinicalWorks EMR/PM Software ([email or web address])

## 2020-07-18 NOTE — Progress Notes (Signed)
* * *        Dale Reynolds    --- ---    75 Y old Male, DOB: 1951/02/08    Account Number: [id number]    [address] [name], [place]    Home: [phone]    Guarantor: Dale Reynolds Insurance: [employer] PPO Payer ID: [id number]    PCP: Rudene Anda, MD    Appointment Facility: [facility], [facility]        * * *    01/09/2018  Progress Notes: [name]. [name], M.D.    --- ---    ---         **Current Medications**    ---    Taking     * Lisinopril Tablet 1 tablet Orally Once a day    ---    * Flomax 0.4 MG Capsule 1 capsule 30 minutes after the same meal each day Orally Once a day    ---    * Tamsulosin HCl 0.4 MG Capsule TAKE ONE CAPSULE BY MOUTH AFTER THE SAME MEAL     ---    * Medication List reviewed and reconciled with the patient    ---      Past Medical History    ---       HTN.        ---    Mitchondrial myopathy.        ---    Nephrolithiasis.        ---    BPH - Hypertrophy (benign) of prostate without urinary obstruction and other  lower urinary tract symptoms [LUTS].        ---    Hematuria, unspecified.        ---    Elevated prostate specific antigen (PSA).        ---       **Surgical History**    ---       arthroscopic knee surgery    ---    appendectomy    ---    left eye surgery 12/07/15    ---    cystoscopy: c/w BPH 08/18/2012    ---    cholecystectomy 06/2017    ---       **Family History**    ---       Father: deceased, ALS    ---    Mother: deceased, diagnosed with Mental Illness    ---    Children: alive    ---    Siblings: alive    ---    1 brother(s) , 2 sister(s) - healthy. 2 son(s) .    ---    no prostate cancer in the family.    ---       **Social History**    ---    Alcohol: yes, social, weekends.    Caffeine: yes, frequency:, coffee, tea, soda, 1-2 cups/day.    no Smoking status: former smoker quit: @2005 , Patient counseled on the dangers  of tobacco use: 01/09/2018.    Occupation: drives a truck.    no Recreational drug use.      **Allergies**     ---       N.K.D.A.    ---       **Hospitalization/Major Diagnostic Procedure**    ---       No Hospitalization History.    ---       **Review of Systems**    ---     _General_ :  no flank pain. no Weakness. no Abdominal pain. no Weight gain.    _Cardiovascular_ :    no Chest pain.    _Gastroenterology_ :    no Constipation. no Diarrhea.    _Urology_ :    no Dysuria. no Blood in urine. no Frequent urination. no Urinary incontinence.            **Reason for Appointment**    ---       1\. 1 yr follow up    ---    2\. Kidney stones    ---    3\. Benign prostatic hyperplasia (BPH)    ---    4\. elevated Prostate specific antigen (PSA)    ---       **History of Present Illness**    ---     _Urological History_ :    Pt is voiding well on the flomax, good emptying, good control, good flow,  nocturia x 0-1; no dysuria, no hematuria, no flank pain.       **Vital Signs**    ---    Ht 6'0", Wt 185, BMI 25.09, BP 128/60, Temp 98.4.       **Examination**    ---     _General_ :    Lungs/Respirations:  No labored breathing, good excursion. Musculoskeletal:  normal gait.    _Genitourinary - Male_ :    General appearance:  alert, oriented, build: average, no apparent distress,  pleasant. Abdomen:  soft, non-tender, no mass, non-distended, no cvat.  Anus/Perineum:  anus was intact, no lesions, no rashes, no fissures/fistulas.  Digital Rectal Exam (DRE):  normal, no masses. Epididymis:  normal, non-  tender, bilateral. Penis  no rashes, normal. Prostate  smooth, normal, non-  tender, slightly enlarged, non-nodular. Scrotum:  no rashes, no lesions,  normal. Sphincter tone:  normal. Testicles:  not enlarged. non-tender, normal  bilaterally. Urethral meatus:  normal, no discharge. HEENT:  Head:  normocephalic, Head: atraumatic, Sclera: anicteric. Lymphatic:  no inguinal  adenopathy noted. Skin:  good turgor.          **Assessments**    ---    1\. Benign non-nodular prostatic hyperplasia with lower urinary tract symptoms  - N40.1     ---    2\. Kidney stones - N20.0    ---    3\. Elevated PSA - R97.2    ---       **Treatment**    ---       **1\. Benign non-nodular prostatic hyperplasia with lower urinary tract  symptoms**    Notes: He is voiding well so will continue the Flomax and he knows to call me  with any changes in voiding..    ---        **2\. Kidney stones**    Notes: Not a clinical issue at this time.        **3\. Elevated PSA**    Notes: We discussed this in length and discussed about the  reasons/risks/benefits of doing a TRUS/Prostate Bx vs. rechecking this in 4  months. because he has been up and down in the past and given his age, will  recheck this but he understands that if it remains high then he needs a  prostate Bx. .        **4\. Others**    Continue Flomax Capsule, 0.4 MG, 1 capsule 30 minutes after the same meal each  day, Orally, Once a day         **Labs**    --- ---  _Lab: ClinitekStatus SG_    ---       GLU  Negative     \-    --- --- --- ---    BIL  Negative     \-    --- --- --- ---    KET  Negative     \-    --- --- --- ---    SG  1.020     \-    --- --- --- ---    BLO  2+     \-    --- --- --- ---    pH  5.5     \-    --- --- --- ---    PRO  Negative     \-    --- --- --- ---    URO  0.2 E.U./dL     \-    --- --- --- ---    NIT  Negative     \-    --- --- --- ---    LEU  Negative     \-    --- --- --- ---         Marcaurelle,Krystle 01/09/2018 1:14:53 PM > Llana Deshazo E 01/09/2018  1:17:17 PM >    --- ---    [name]: Urine Microscopy_       WBC  0       --- --- --- ---    RBC  0-1       --- --- --- ---    Epithelial  0       --- --- --- ---    Bacteria  0       --- --- --- ---    Amorphous  trace       --- --- --- ---    Mucous  0       --- --- --- ---    Casts  0       --- --- --- ---    Other  0       --- --- --- ---         Marcaurelle,Krystle 01/09/2018 1:30:00 PM > Merwyn Hodapp E 01/09/2018  1:36:13 PM >    --- ---    [name]: PSA, total_ 7.4 ng/ml       level  7.4 ng/ml       --- --- --- ---          Sherlene Shams 01/09/2018 1:29:05 PM > Reta Norgren E 01/09/2018  1:31:32 PM >    --- ---        Procedure Orders:    --- ---    _Procedure: BLADDER SCAN_    ---         Phakonekham,Somchay 01/09/2018 1:27:30 PM > PVR approx 22cc    --- ---       **Preventive Medicine**    ---       Counseling: Stone Prevention Diet Counseled on increased fluid intake,  decreased salt intake 01/09/2018.    ---      **Procedure Codes**    ---       81003 UA Automated    ---    81015 MICROSCOPIC EXAM OF URINE    ---    16109 BLADDER SCAN    ---    60454 PSA    ---       **Follow Up**    ---    4 Months    Electronically signed by [name] , MD on 01/09/2018 at 08:48 PM  EDT    Sign off status: Completed        * * [facility]    [address]    [place], [place] [id number]    Tel: [phone]    Fax: [phone]              * * *          Patient: Dale Reynolds, Dale Reynolds DOB: [birth year] Progress Note: [name]. [name], M.D.  01/09/2018    ---    Note generated by eClinicalWorks EMR/PM Software ([email or web address])

## 2020-07-18 NOTE — Progress Notes (Signed)
* * *        **  [name]**    --- ---    71 Y old Male, DOB: 09-Feb-1951    [address] [place] [address]    Home: [phone]    Provider: Mitchel Honour, MD        * * *    Web Encounter    ---    Answered by   Dorna Mai  Date: 03/23/2017         Time: 06:25 PM    Caller   [name]    --- ---            Reason   Update Demographics - Personal Info            Message                      Please Update Demographic Information                Action Taken                      [facility]  03/24/2017 12:17:22 PM > updated                    * * *                ---          * * *          Patient: KENAN, MOODIE D DOB: 1950-07-24 Provider: Mitchel Honour, MD  03/23/2017    ---    Note generated by eClinicalWorks EMR/PM Software ([email or web address])

## 2020-07-18 NOTE — Progress Notes (Signed)
* * *        **  Dale Reynolds    --- ---    45 Y old Male, DOB: 1951-04-04    [address] [place], [place], [place] [address]    Home: [phone]    Provider: Aletta Edouard        * * *    Telephone Encounter    ---    Answered by   Aletta Edouard  Date: 12/27/2016         Time: 12:21 PM    Action Taken   Catricia Scheerer E 12/27/2016 12:21:37 PM > he needs an appt: not  urgent Kawa,Diane 12/30/2016 9:19:43 AM > rescheduled appt to 9/14 spoke to pt    --- ---                * * *                ---          * * *          Patient: Dale Reynolds DOB: 06-Nov-1950 Provider: Aletta Edouard 12/27/2016    ---    Note generated by eClinicalWorks EMR/PM Software ([email or web address])

## 2020-07-18 NOTE — Progress Notes (Signed)
* * *        Dale Reynolds    --- ---    58 Y old Male, DOB: 07-22-1950    Account Number: [id number]    [address] [place], [id number]    Home: [phone]    Guarantor: Dale Reynolds Insurance: [employer] PPO Payer ID: [id number]    PCP: Rudene Anda, MD    Appointment Facility: [facility], [facility].        * * *    01/03/2017  Progress Notes: Dale Reynolds. Dale Reynolds, M.D.    --- ---    ---        Current Medications    ---    Taking     * Lisinopril Tablet 1 tablet Orally Once a day    ---    * Flomax 0.4 MG Capsule 1 capsule 30 minutes after the same meal each day Orally Once a day    ---    * Tamsulosin HCl 0.4MG  Capsule TAKE ONE CAPSULE BY MOUTH 30 MINS AFTER THE SAME MEAL     ---      Past Medical History    ---       HTN.        ---    Mitchondrial myopathy.        ---    Nephrolithiasis.        ---    BPH - Hypertrophy (benign) of prostate without urinary obstruction and other  lower urinary tract symptoms [LUTS].        ---    Hematuria, unspecified.        ---    Elevated prostate specific antigen (PSA).        ---      Surgical History    ---      arthroscopic knee surgery    ---    appendectomy    ---    left eye surgery 12/07/15    ---    cystoscopy: c/w BPH 08/18/2012    ---      Family History    ---      Father: deceased, ALS    ---    Mother: deceased, diagnosed with Mental Illness    ---    Children: alive    ---    Siblings: alive    ---    1 brother(s) , 2 sister(s) - healthy. 2 son(s) .    ---    no prostate cancer in the family.    ---      Social History    ---    Alcohol: yes, social, weekends.    no Smoking status: former smoker quit: @2005 , Patient counseled on the dangers  of tobacco use: 01/03/2017.    Occupation: drives a truck.    no Recreational drug use.    Caffeine: yes, frequency:, coffee, tea, soda, 1-2 cups/day.      Allergies    ---      N.K.D.A.    ---      Review of Systems    ---     _General_ :    no Fever. no Chills. no Weakness. no Abdominal pain.     _Cardiovascular_ :    no Chest pain.    _Gastroenterology_ :    no Constipation. no Diarrhea.    _Urology_ :    no Blood in urine. no Frequent urination. no Urinary incontinence.  Reason for Appointment    ---      1\. Benign prostatic hyperplasia (BPH)    ---    2\. elevated Prostate specific antigen (PSA)    ---    3\. Kidney stones    ---      History of Present Illness    ---     _Urological History_ :    pt is voiding well: good flow, good control, nocturia x 0-1; no hematuria, no  dysuria: on the Flomax.      Vital Signs    ---    Ht 6'0", Wt 196.4, BMI 26.63, BP 122/60, Temp 98.4.      Examination    ---     _General_ :    Lungs/Respirations: No labored breathing, good excursion. Musculoskeletal:  normal gait.    _Genitourinary - Male_ :    General appearance: alert, oriented, build: average, no apparent distress,  pleasant. Abdomen: soft, non-tender, no mass, no cvat, non-distended.  Anus/Perineum: anus was intact, no lesions, no rashes, no fissures/fistulas.  Digital Rectal Exam (DRE): normal, no masses. Epididymis: normal, non-tender,  bilateral. Penis normal, no rashes. Prostate smooth, normal, non-tender,  slightly enlarged. Scrotum: no rashes, no lesions, normal. Sphincter tone:  normal. Testicles: not enlarged. non-tender, normal bilaterally. Urethral  meatus: normal, no discharge. HEENT: Head: normocephalic, Head: atraumatic,  Sclera: anicteric. Lymphatic: no inguinal adenopathy noted. Skin: good turgor.  Neurological: no focal sensory deficits, no focal motor deficits.          Assessments    ---    1\. Benign non-nodular prostatic hyperplasia with lower urinary tract symptoms  - N40.1 (Primary)    ---    2\. Kidney stones - N20.0    ---    3\. Elevated PSA - R97.2    ---      Treatment    ---       **1\. Benign non-nodular prostatic hyperplasia with lower urinary tract  symptoms**    Notes: he is voiding well on the Flomax and will continue and knows to call me  if his voiding changes.     ---        **2\. Kidney stones**    Notes: given his last CT will observe and consider a renal US in the future.  He knows to hydrate well.        **3\. Elevated PSA**    Notes: His PSA is stable so will continue to observe.        **4\. Others**    Continue Tamsulosin HCl Capsule, 0.4MG , TAKE ONE CAPSULE BY MOUTH 30 MINS  AFTER THE SAME MEAL    Notes: Patient Educated with: BPH.pdf (BPH.pdf).        Labs    --- ---    Dale Reynolds: Urine Culture, Routine_    ---    _Lab: ClinitekStatus SG_       GLU  Negative     \-    --- --- --- ---    BIL  Negative     \-    --- --- --- ---    KET  Negative     \-    --- --- --- ---    SG  1.010     \-    --- --- --- ---    BLO  Moderate     \-    --- --- --- ---    pH  5.5     \-    --- --- --- ---  PRO  Negative     \-    --- --- --- ---    URO  0.2 E.U./dL     \-    --- --- --- ---    NIT  Negative     \-    --- --- --- ---    LEU  Trace     \-    --- --- --- ---         Dale Reynolds January 11, 2017 1:57:33 PM > Ski Polich E 2017/01/11  2:18:39 PM >    --- ---    Dale Reynolds: Urine Microscopy_       WBC  0-1       --- --- --- ---    RBC  0-1       --- --- --- ---    Epithelial  0       --- --- --- ---    Bacteria  0       --- --- --- ---    Amorphous  trace       --- --- --- ---    Mucous  0       --- --- --- ---    Casts  0       --- --- --- ---    Other  0       --- --- --- ---         Dale Reynolds 01-11-2017 2:23:27 PM > Dale Reynolds E Jan 11, 2017  2:28:06 PM >    --- ---    Dale Reynolds: PSA, total_ 5.1 ng/ml       level  5.1 ng/ml       --- --- --- ---         Dale Reynolds 2017-01-11 2:22:54 PM > Dale Reynolds E 2017/01/11  2:25:14 PM >    --- ---      Preventive Medicine    ---      Counseling: Stone Prevention Diet Counseled on increased fluid intake,  decreased salt intake January 11, 2017.    ---      Procedure Codes    ---      81003 UA Automated    ---    46962 UCCC    ---    95284 MICROSCOPIC EXAM OF URINE    ---    13244 PSA    ---      Follow Up    ---    1 Year     Electronically signed by Dale Reynolds , MD on 01-11-2017 at 06:44 PM EDT    Sign off status: Completed        * * [facility]    [address]    [place], [place] [id number]    Tel: [phone]    Fax: [phone]              * * *          Patient: Dale Reynolds, Dale Reynolds DOB: 1950-06-19 Progress Note: Dale Reynolds. Dale Reynolds, M.D.  2017-01-11    ---    Note generated by eClinicalWorks EMR/PM Software ([email or web address])

## 2020-07-18 NOTE — Progress Notes (Signed)
**  PROGRESS NOTES**    ---    **Patient:** Dale Reynolds, Dale Reynolds     **Account Number:** [id number]   **Provider:** Shawntina Diffee E. [name], M.D.     **DOB:** 10-13-1950 **Age:** 29 Y **Sex:** Male   **Date:** 08/07/2017     **Phone:** [phone]     **Address:** [address] [place], [place], [id number]     **Pcp:** Rudene Anda, MD        * * *        **Subjective:**        ---       **Chief Complaints:**    --- ---         --- ---      **Medical History:**        --- ---        **Objective:**        ---         **Assessment:**        ---         **Plan:**        ---         --- ---          **Provider:** Sharie Amorin E. [name], M.D.    ---     **Patient:** Dale Reynolds, Dale Reynolds **DOB:** April 18, 1951 **Date:** 08/07/2017    ---    Electronically signed by Jeani Hawking on 08/07/2017 at 04:20 PM EDT    Sign off status: Completed

## 2020-07-18 NOTE — Progress Notes (Signed)
* * *        **  Dale Reynolds    --- ---    69 Y old Male, DOB: [birth year]    [address] [place], [place], [place] [address]    Home: [phone]    Provider: Aletta Edouard        * * *    Telephone Encounter    ---    Answered by   Aletta Edouard  Date: 01/17/2017         Time: 08:46 AM    Action Taken   Isobella Ascher E 01/17/2017 8:46:20 AM > I called the pt about  his CT scan. he is doing better but still with a little pain on /off so I will  see him in a month to make sure this small stone passed instead of being at  the [facility]. He knows to call us if his pain increases. : Appt made    --- ---                * * *                ---          * * *          Patient: Dale, Reynolds DOB: 1950-12-20 Provider: Aletta Edouard 01/17/2017    ---    Note generated by eClinicalWorks EMR/PM Software ([email or web address])

## 2020-07-18 NOTE — Progress Notes (Signed)
* * *        **  Dale Reynolds    --- ---    32 Y old Male, DOB: Dec 22, 1950    Account Number: [id number]    [address] [name], [id number]    Home: [phone]    Guarantor: Dale Reynolds Insurance: [employer] P[employer] Payer ID: [id number]    PCP: Rudene Anda, MD    Appointment Facility: [facility], [facility].        * * *    12/27/2016  Progress Notes: [name]. [name], M.D.    --- ---    ---        Current Medications    ---    Taking     * Lisinopril Tablet 1 tablet Orally Once a day    ---    * Flomax 0.4 MG Capsule 1 capsule 30 minutes after the same meal each day Orally Once a day    ---    * Tamsulosin HCl 0.4MG  Capsule TAKE ONE CAPSULE BY MOUTH 30 MINS AFTER THE SAME MEAL     ---      Past Medical History    ---       HTN.        ---    Mitchondrial myopathy.        ---    Nephrolithiasis.        ---    BPH - Hypertrophy (benign) of prostate without urinary obstruction and other  lower urinary tract symptoms [LUTS].        ---    Hematuria, unspecified.        ---    Elevated prostate specific antigen (PSA).        ---        Reason for Appointment    ---      1\. 1 yr follow up    ---    2\. Benign prostatic hyperplasia (BPH)    ---    3\. elevated Prostate specific antigen (PSA)    ---    4\. Kidney stones    ---      Treatment    ---       **1\. Others**    Notes: pt did not show.    ---    Electronically signed by [name] , MD on 12/27/2016 at 12:21 PM EDT    Sign off status: Completed        * * [facility]    [address]    [place], [place] [id number]    Tel: [phone]    Fax: [phone]              * * *          Patient: Dale Reynolds, Dale Reynolds DOB: 06/19/1950 Progress Note: [name]. [name], M.D.  12/27/2016    ---    Note generated by eClinicalWorks EMR/PM Software ([email or web address])

## 2020-07-18 NOTE — Progress Notes (Signed)
* * *        Dale Reynolds    --- ---    10 Y old Male, DOB: [birth year]    [address] [place], [place], [place] [address]    Home: [phone]    Provider: Aletta Edouard        * * *    Telephone Encounter    ---    Answered by   Jaynie Crumble  Date: 01/08/2017         Time: 09:32 AM    Caller   pt    --- ---            Reason   Ct complete- f/u okay? Please advise            Message                      left side  thinks it his kidney stone  acting up   since yesterday    please all him @  [phone]                Action Taken   Kawa,Diane 01/08/2017 9:33:34 AM > Leahy,Nancy 01/08/2017 9:43:16  AM > no answer @ [phone] Leahy,Nancy 01/08/2017 11:30:24 AM > pt states  pain is on left- 11/2015 - RA said if stones acted up get an [place] - ok to order  Cohen,Matthew A 01/08/2017 11:42:57 AM > yes renal US thanks Leahy,Nancy  01/08/2017 12:09:15 PM > order is in- pt is off today until Friday - soonest  they can do for him - call pt with time and date Kawa,Diane 01/08/2017 12:38:19  PM > scheduled ultrasound @ [facility] 9/20 1:30 spoke to pt he is aware and will be  there Leahy,Nancy 01/08/2017 12:58:19 PM > pt has no stones and mild bilateral  hydro - they recommend a CT - maybe ask RE if we should order or let pt know  we will discuss with RA monday if not - lm at [phone] to call or call him  with update...thanks Edelstein,Robert A 01/10/2017 12:37:46 PM > OK to discuss  with Dr. [name] on Monday. THanks, RE [facility] 01/10/2017 1:43:42 PM >  Pt looking for [place] results Makinley Muscato E 01/12/2017 5:17:50 PM > we can get a  urolith CT scan [facility] 01/13/2017 10:22:08 AM > CT order is in = please  call pt and book Kawa,Diane 01/13/2017 11:10:10 AM > scheculed 9/27 @ 10:30 @  [facility] pt is aware Leahy,Nancy 01/13/2017 11:29:30 AM > can you get his  results and send this msg and results to myself /Larissa when ready  Phakonekham,Somchay 01/17/2017 8:07:15 AM > CT scan is in pt's doc  Winchester,Larissa 01/17/2017  11:24:06 AM > ct in pt docs- small punctuate  stones- had f/u 11/9- please let me lknow if he needs sooner, ty  Wallie Lagrand E 01/17/2017 11:29:33 AM > I called him this AM: see tel  encounter.Marland Kitchen all set [facility] 01/17/2017 11:47:05 AM > noted                * * *              * * *        ---        Reason for Appointment    ---      1\. Sever pain    ---         **Medical History:**   HTN.    ---  Mitchondrial myopathy.    ---    Nephrolithiasis.    ---    BPH - Hypertrophy (benign) of prostate without urinary obstruction and other  lower urinary tract symptoms [LUTS].    ---    Hematuria, unspecified.    ---    Elevated prostate specific antigen (PSA).    ---      Assessments    ---    1\. Kidney stones - N20.0 (Primary)    ---    2\. Acute left flank pain - R10.9    ---      Treatment    ---       **1\. Kidney stones**    _IMAGING: CT Abdomen and Pelvis Prone C- e-sch*_     scheduled at [facility] on 01/16/17 10:30 am Prior Auth # [id number]  (02/11/17) Kawa,Diane 01/13/2017 11:09:30 AM > Takeru Bose E 01/16/2017  6:25:47 PM >    --- ---    Ardine Bjork: [place] Retroperitoneal Complete e-sch*_   scheduled at [facility] on 01/09/17 1:30 pm MUST DRINK 32oz WATER ONE HOUR BEFORE AND HOLD  Kawa,Diane 01/08/2017 12:38:50 PM > Hildagarde Holleran E 01/13/2017 4:07:59 PM >    --- ---         **2\. Acute left flank pain**    _IMAGING: CT Abdomen and Pelvis Prone C- e-sch*_     scheduled at [facility] on 01/16/17 10:30 am Prior Auth # [id number]  (02/11/17) Kawa,Diane 01/13/2017 11:09:30 AM > Nakiyah Beverley E 01/16/2017  6:25:47 PM >    --- ---    Ardine Bjork: [place] Retroperitoneal Complete e-sch*_   scheduled at [facility] on 01/09/17 1:30 pm MUST DRINK 32oz WATER ONE HOUR BEFORE AND HOLD  Kawa,Diane 01/08/2017 12:38:50 PM > Donte Kary E 01/13/2017 4:07:59 PM >    --- ---          * * *          Patient: Dale Reynolds DOB: Sep 11, 1950 Provider: Aletta Edouard  01/08/2017    ---    Note generated by eClinicalWorks EMR/PM Software ([email or web address])

## 2020-07-18 NOTE — Progress Notes (Signed)
* * *        **  [name]    --- ---    5 Y old Male, DOB: 06-15-1950    [address] [place], [place], [place] [address]    Home: [phone]    Provider: Aletta Edouard        * * *    Telephone Encounter    ---    Answered by   Alford Highland  Date: 03/12/2017         Time: 08:13 AM    Caller   self    --- ---            Reason   returning call            Message                      Patient had a few questions regarding your message - he can be reached on his cell [phone]                Action Taken   Hooton,Deborah 03/12/2017 8:13:18 AM > Adriaan Maltese E  03/12/2017 8:48:14 AM > I called him and we got cut off.. will try later  Lafe Clerk E 03/12/2017 8:52:35 AM > I spoke with him: he still has his  mid lower back pain that appears to be non GU in nature with no renal colic or  LUTS for now so will observe but he knows to call me with a change in pain or  voiding.                * * *                ---          * * *          Patient: Joanathan, Affeldt DOB: 04-14-1951 Provider: Aletta Edouard 03/12/2017    ---    Note generated by eClinicalWorks EMR/PM Software ([email or web address])

## 2023-03-04 IMAGING — MR MRI LEFT HIP WITHOUT CONTRAST
4 of 6 series · 17 of 40 positions shown · IV contrast (gadolinium)
Comparison: None.

________________________________________________________________________________________________ 
MRI LEFT HIP WITHOUT CONTRAST, 03/04/2023 [DATE]: 
CLINICAL INDICATION: Pain in left hip. Patient denies recent trauma.
TECHNIQUE: Multiplanar, multiecho position MR images of the pelvis and left hip 
were performed without intravenous gadolinium enhancement. Small field-of-view 
imaging was performed of the hip.

[Series 301: survey · axial · 15.0mm · 1.76mm/px · z∈[-143,+107]mm · 3 of 11 slices shown]
[im 1/11]
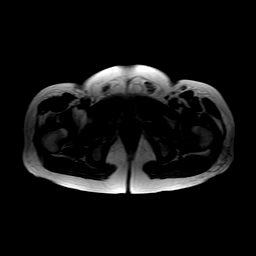
[im 6/11]
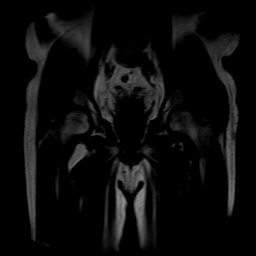
[im 11/11]
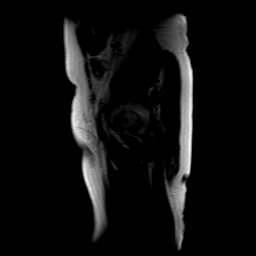

[Series 401: stir_cor-pelvis · coronal · 5.0mm · 0.73mm/px · 7 of 30 slices shown]
[im 1/30]
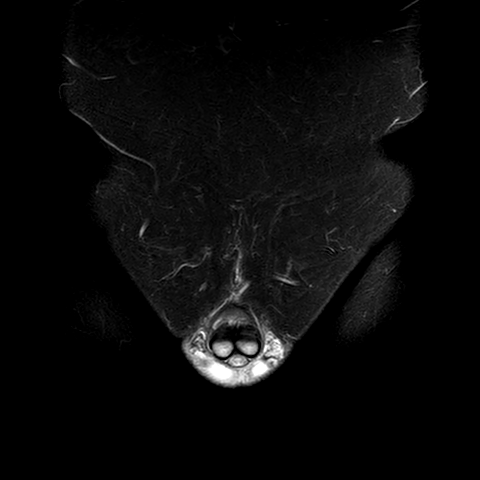
[im 5/30]
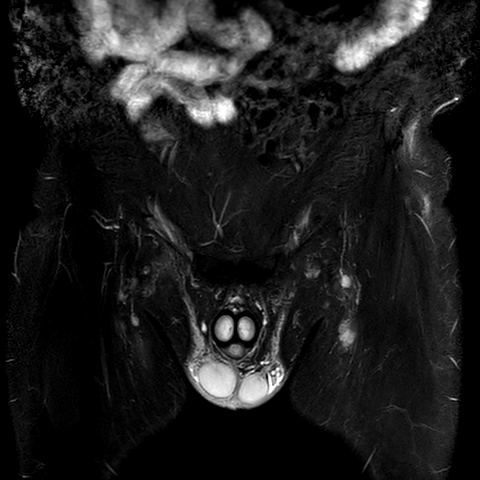
[im 10/30]
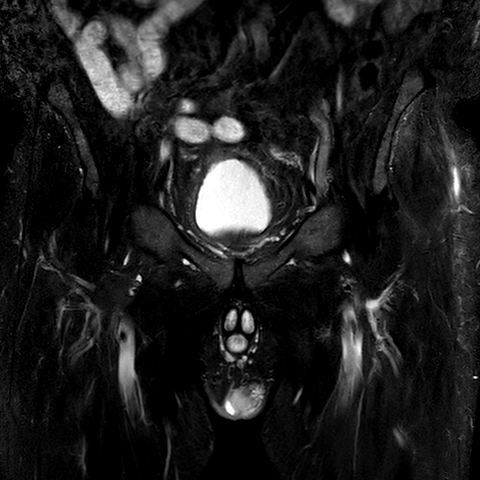
[im 15/30]
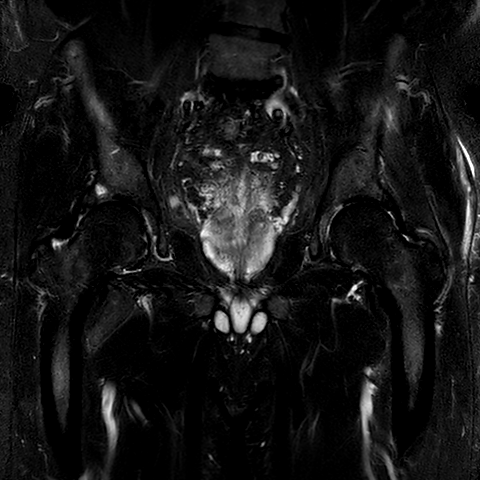
[im 20/30]
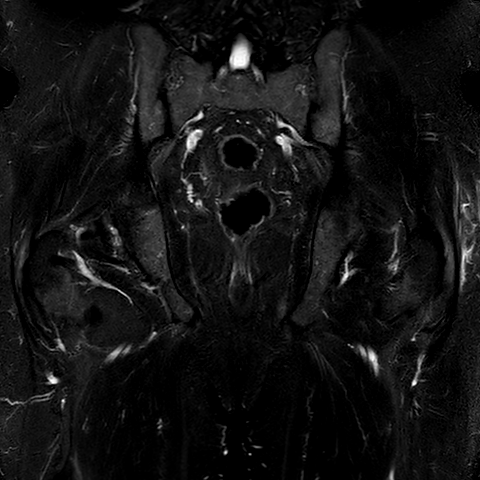
[im 25/30]
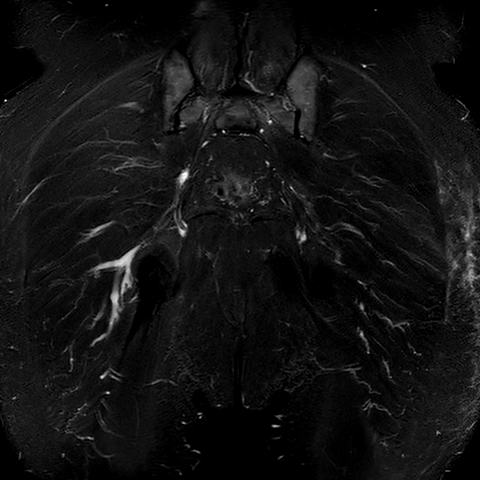
[im 30/30]
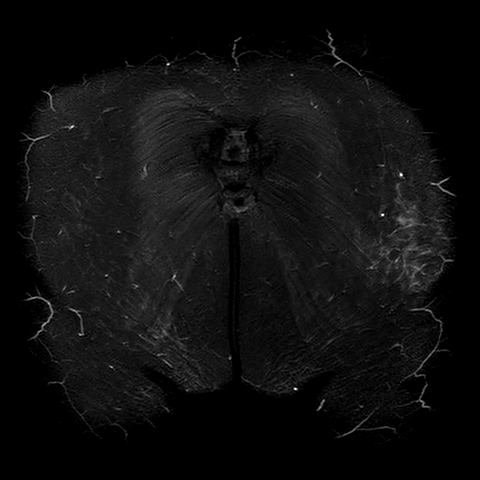

[Series 501: t1_(person_name) · axial · 5.0mm · 0.44mm/px · z∈[-231,-27]mm · 4 of 40 slices shown]
[im 1/40]
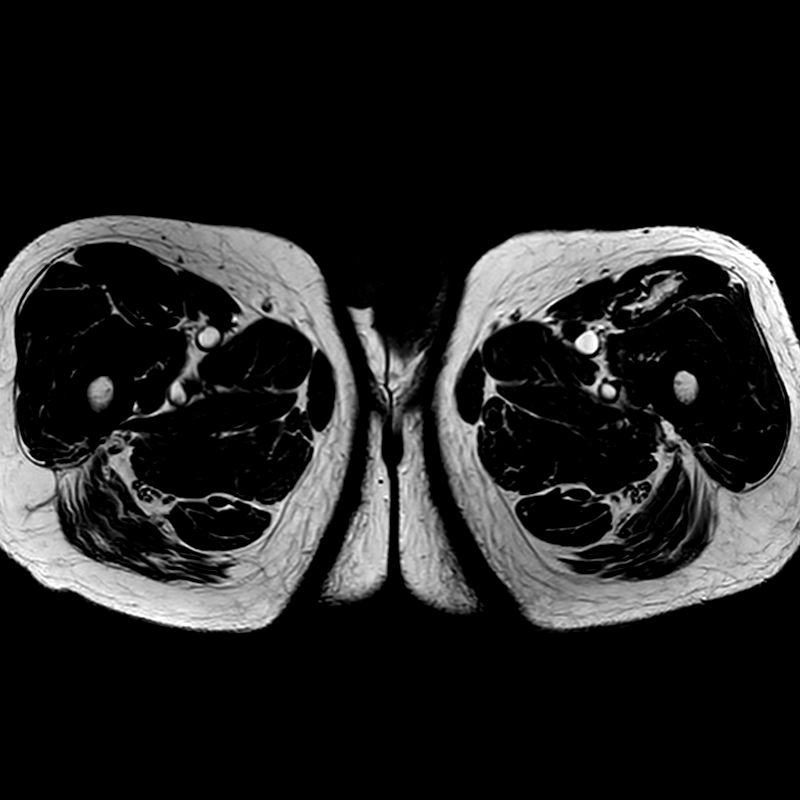
[im 5/40]
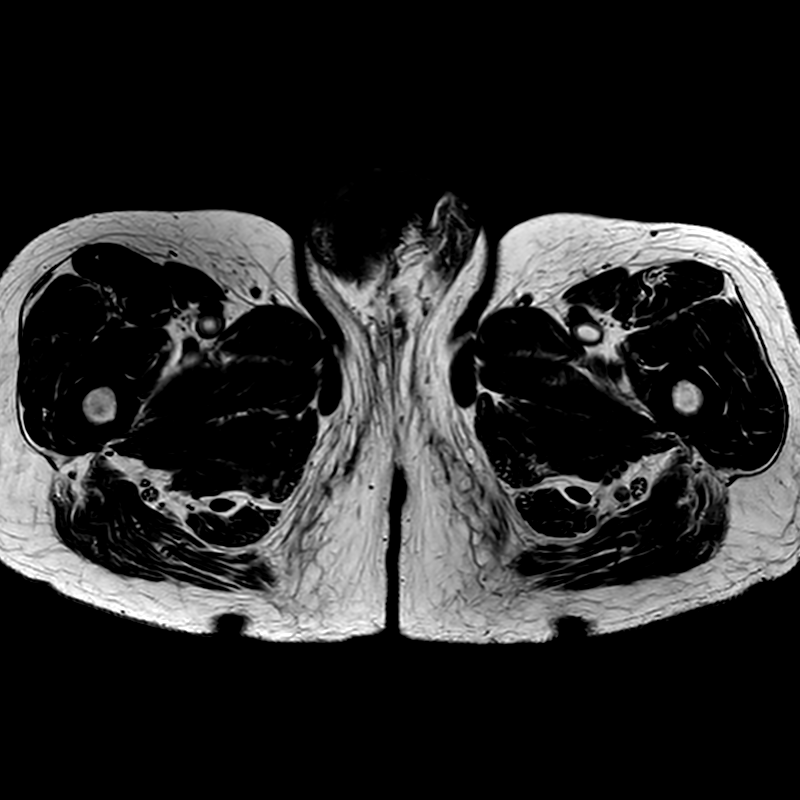
[im 22/40]
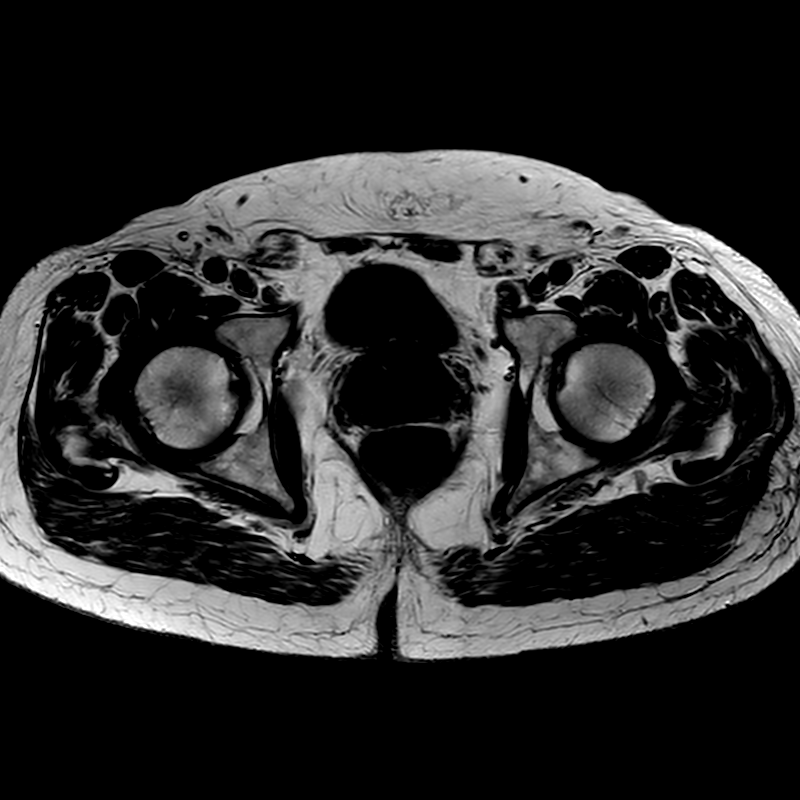
[im 35/40]
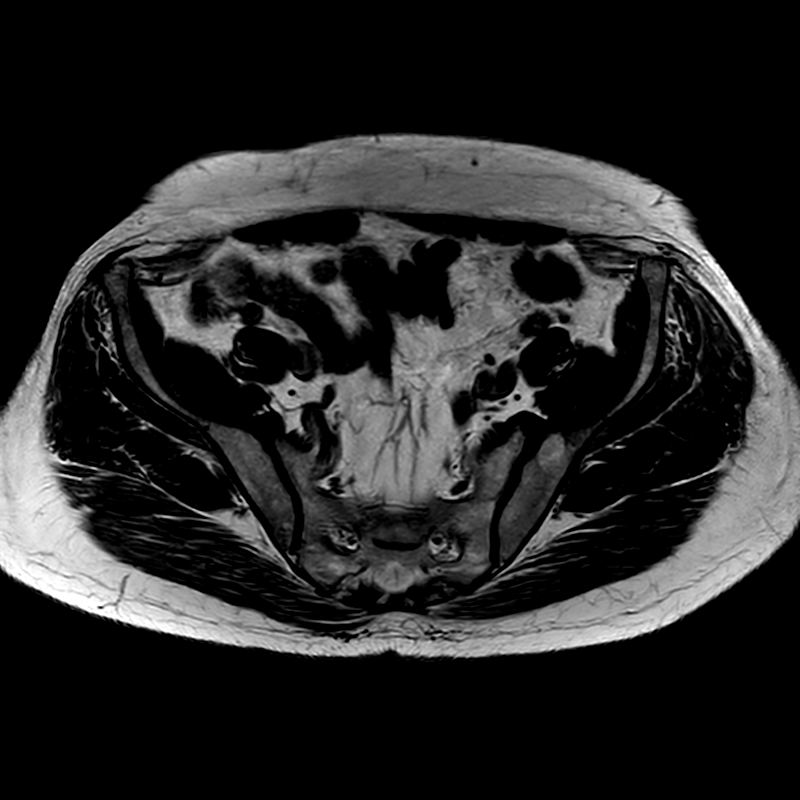

[Series 601: pd_fs_sag · sagittal · 4.0mm · 0.55mm/px · 3 of 32 slices shown]
[im 5/32]
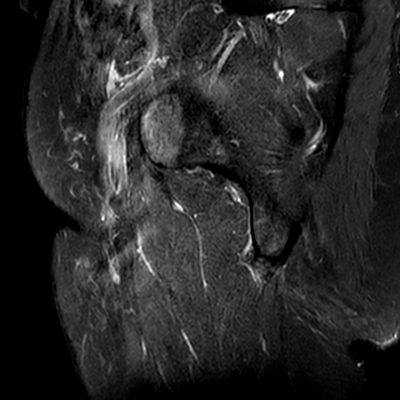
[im 18/32]
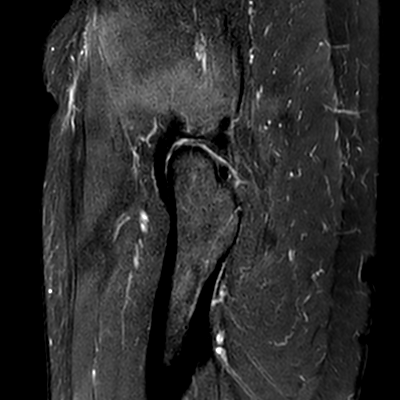
[im 27/32]
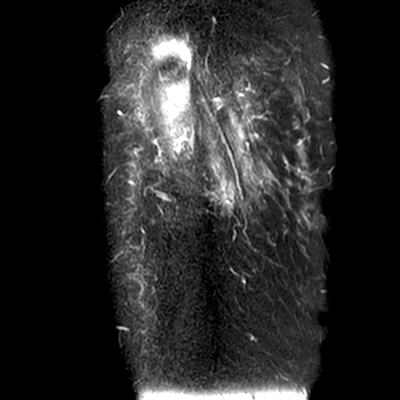

[17 of 40 positions shown; findings below may reference images not displayed]

FINDINGS: HIPS: Mild degenerative change of the left hip with partial thickness 
chondromalacia, small superior acetabular subcortical cysts and degenerative 
labral tears. Small anterior left femoral neck synovial herniation pits. 
Moderate degenerative change of the right hip with partial-thickness and 
full-thickness chondromalacia, superior acetabular subcortical cysts (up to
cm) and degenerative labral tears. No paralabral cyst. No hip joint effusion. 
Both femoral heads maintain a spherical configuration without evidence of 
avascular necrosis or subarticular collapse. No abnormal morphology of the 
proximal femurs or acetabulum to predispose to impingement. 
PELVIC BONES: Normal marrow signal intensity. No fracture, contusion or marrow 
replacing lesion.  
SI JOINTS: Degenerative change of the SI joints with anterior osteophytes. 
PUBIC SYMPHYSIS: Preserved. 
SPINE: Multilevel degenerative change of the spine. 
SOFT TISSUES: 7.2 x 3.2 x 4.5 cm fatty mass with thin septations in the proximal 
right thigh abuts the femoral neck/proximal shaft, abductor/quadratus femoris 
muscles and the femoral neurovascular bundle. No thick septations or nodular 
nonadipose components. Small amount of fluid and soft tissue swelling about the 
left iliotibial band at the level of the pelvis without tear. The gluteal 
tendons are intact. No trochanteric bursitis. The origins of the hamstrings are 
intact. The rectus abdominis-adductor aponeurotic complexes are intact. No mass, 
free fluid or adenopathy. Prostatomegaly (5.6 x 4.2 x 5.3 cm). Susceptibility 
artifact in the prostate. Colonic diverticulosis. The bladder is unremarkable.
IMPRESSION: 1.  Mild degenerative change of the left hip and degenerative labral tears.  
2.  Small amount of fluid and soft tissue swelling about the left iliotibial 
band. 
3.  7.2 x 3.2 x 4.5 cm fatty mass with thin septations in the proximal right 
thigh: Differential diagnosis lipoma and low-grade liposarcoma: Recommend 
comparison with any prior studies, oncologic orthopedic consultation and 
consideration for 3-6 month MRI follow-up without and with contrast. 
4.  Moderate degenerative change of the right hip and degenerative labral tears. 

5.  Prostatomegaly and susceptibility artifact in the prostate: Please correlate 
with surgical history.  
6.  Colonic diverticulosis.

## 2023-04-22 IMAGING — MR MRI RIGHT HIP WITH AND WITHOUT CONTRAST
7 of 8 series · 32 of 40 positions shown · IV contrast (Gadolinium)
Comparison: MR exam of the left hip of 03/04/2023 which partially included the 
right hip.

________________________________________________________________________________________________ 
MRI RIGHT HIP WITH AND WITHOUT CONTRAST, 04/22/2023 [DATE]: 
CLINICAL INDICATION: Right hip pain. Evaluate mass.
TECHNIQUE: Multiplanar, multiecho position MR images of the hip were performed 
without and with intravenous gadolinium enhancement. 8.5 mL of Gadavist were 
injected intravenously by hand. 1.5 mL of Gadavist discarded. Patient was 
scanned on a 1.5T magnet.  
Right hip:  Advanced articular cartilaginous right hip. There is subcortical 
cystic change involving the acetabulum. There is multifocal degenerative 
tearing/fraying of the labrum. Mild osteophytic spurring.  
BONES: Bone marrow signal intensity of the proximal right femur and included 
portion of the pelvis is negative for fracture or contusion or focal marrow 
replacing lesion. 
SOFT TISSUES: Again seen is the circumscribed lobulated fat-containing mass 
within septations within the proximal right thigh abutting the right iliopsoas 
tendon and muscle to the level of the lesser trochanter. This mass abuts the 
lateral aspect of the right abductor musculature and is dorsal to the femoral 
vasculature. This again measures 7.2 x 3.2 x 4.5 cm. No nodularity. No abnormal 
enhancement. There is low-grade partial tearing of the right abductor cuff with 
trace peritrochanteric/peritendinous edema. The origins of the right hamstrings 
are preserved. The insertion of the right iliopsoas tendon is intact.

[Series 101: survey_fullfov_transversal · axial · 10.0mm · 1.66mm/px · z∈[-51,+51]mm · 2 of 7 slices shown]
[im 1/7]
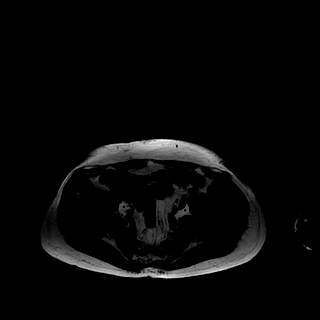
[im 7/7]
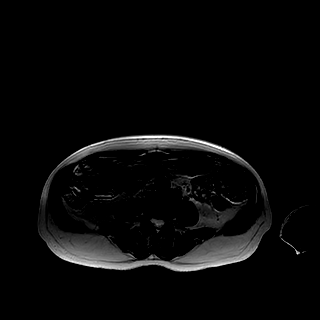

[Series 201: survey · axial · 15.0mm · 1.76mm/px · z∈[+20,+263]mm · 3 of 14 slices shown (1 of 2)]
[im 1/14]
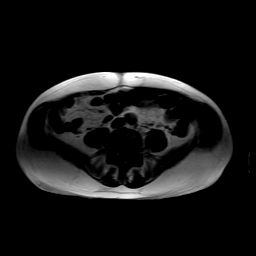
[im 7/14]
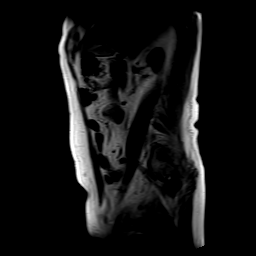
[im 14/14]
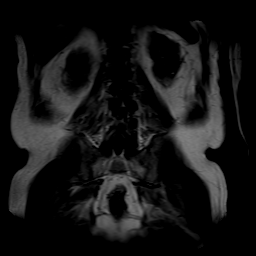

[Series 301: survey · axial · 15.0mm · 1.76mm/px · z∈[-181,+68]mm · 3 of 14 slices shown (2 of 2)]
[im 1/14]
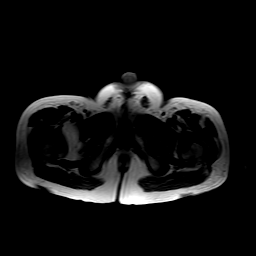
[im 7/14]
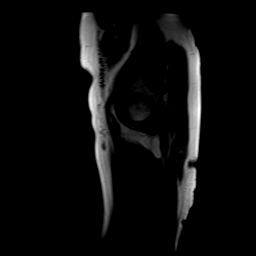
[im 14/14]
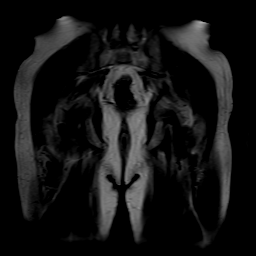

[Series 401: pd_fs_sag · sagittal · 4.0mm · 0.55mm/px · 7 of 32 slices shown]
[im 1/32]
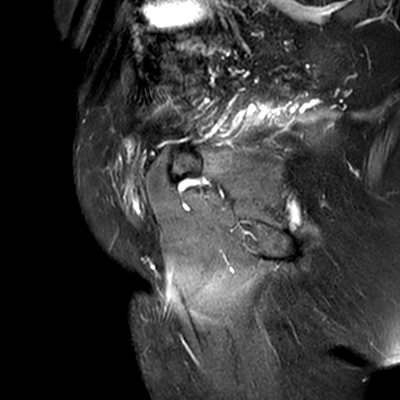
[im 6/32]
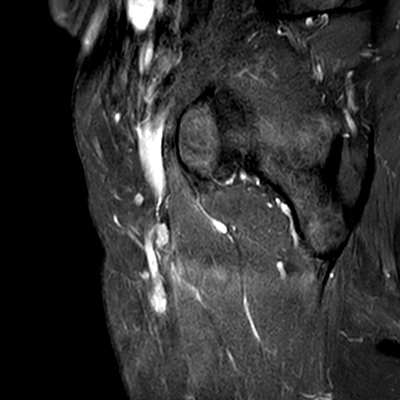
[im 11/32]
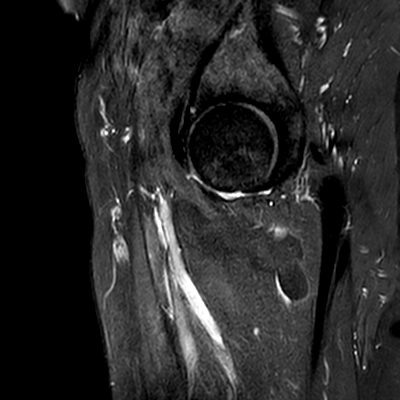
[im 16/32]
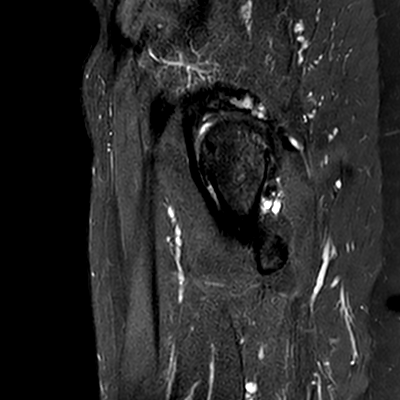
[im 21/32]
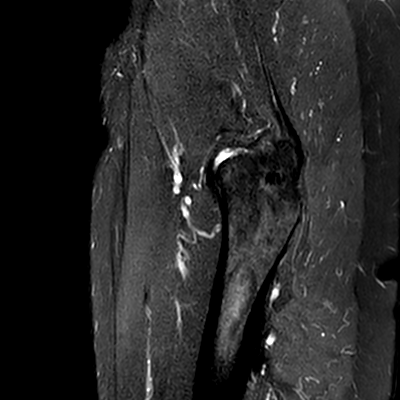
[im 26/32]
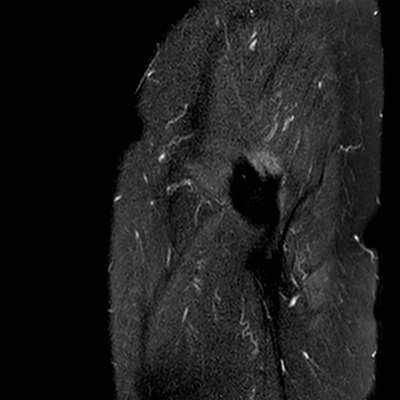
[im 32/32]
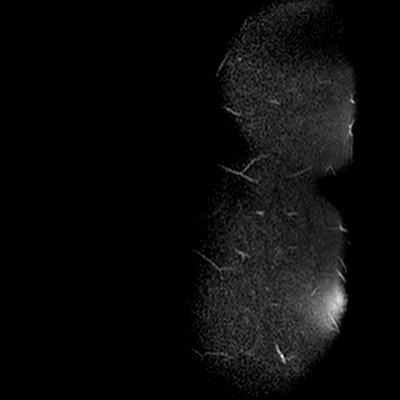

[Series 501: (person_name)_(person_name)_(person_name)_ · axial · 4.0mm · 0.41mm/px · z∈[-228,-88]mm · 7 of 29 slices shown]
[im 1/29]
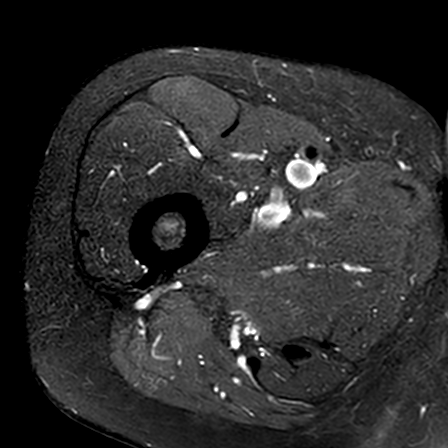
[im 5/29]
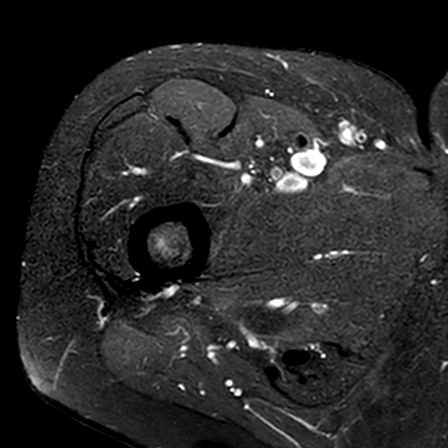
[im 10/29]
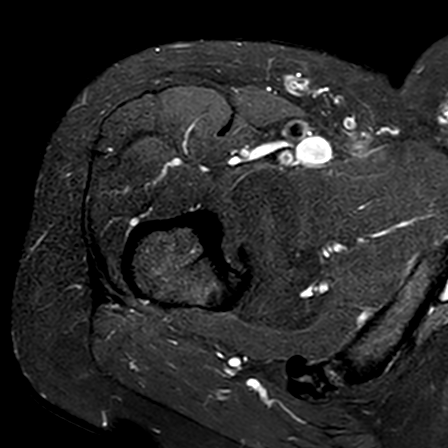
[im 15/29]
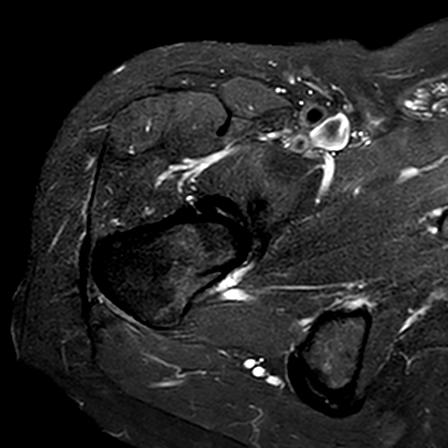
[im 19/29]
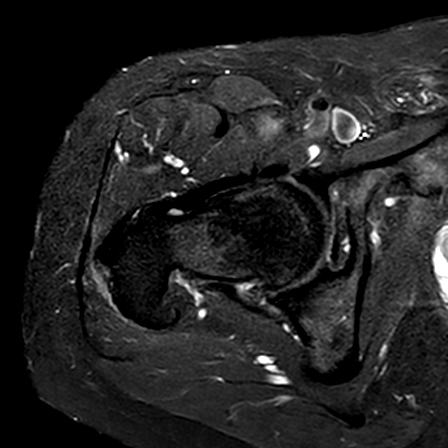
[im 24/29]
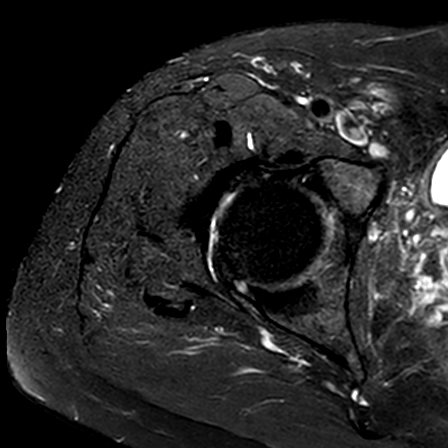
[im 29/29]
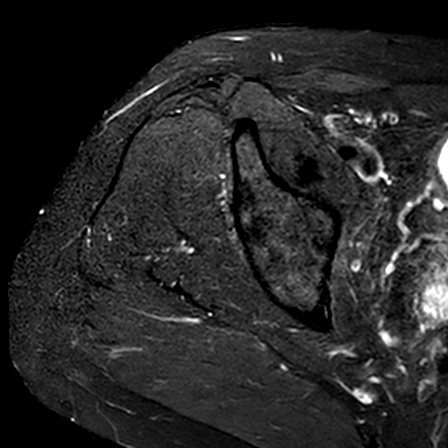

[Series 601: pd_fs_cor · coronal · 4.0mm · 0.49mm/px · 5 of 22 slices shown]
[im 1/22]
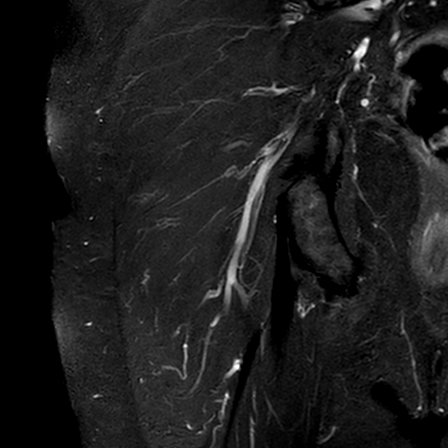
[im 6/22]
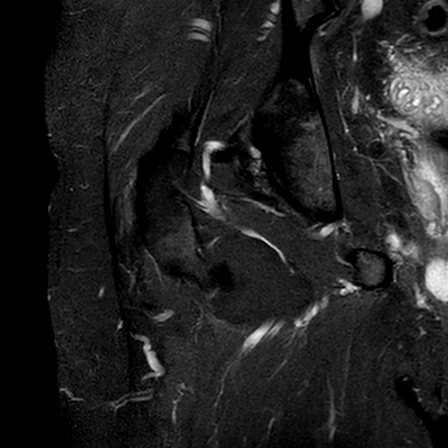
[im 11/22]
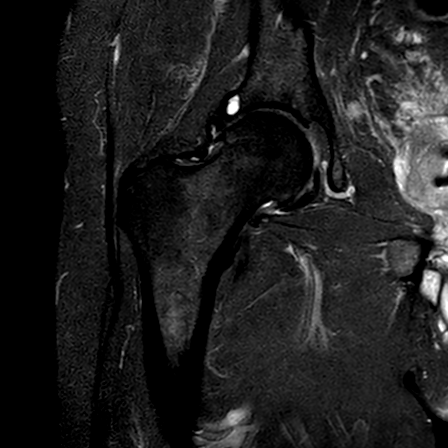
[im 16/22]
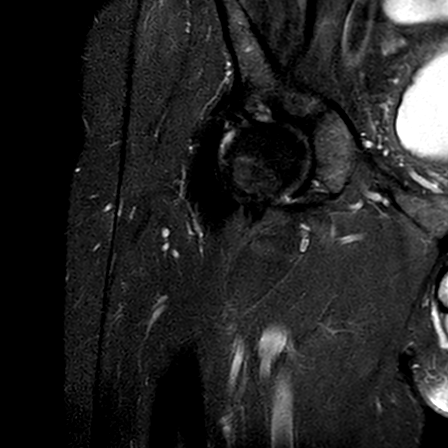
[im 22/22]
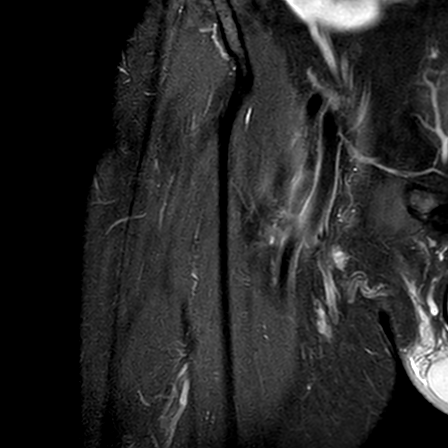

[Series 701: t1_(person_name)_(person_name)_(person_name) · axial · 4.0mm · 0.47mm/px · z∈[-228,-138]mm · 5 of 29 slices shown]
[im 1/29]
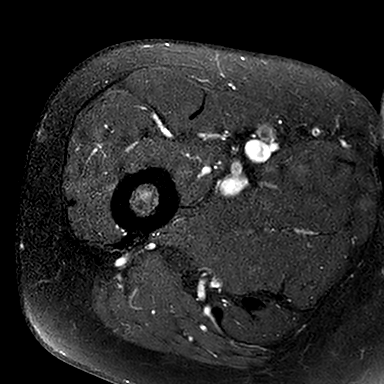
[im 5/29]
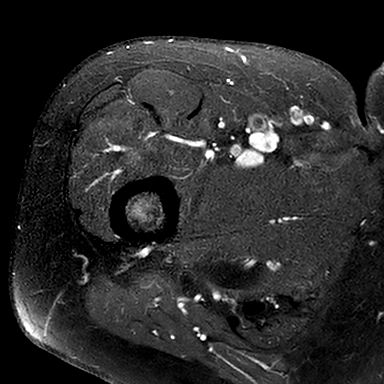
[im 10/29]
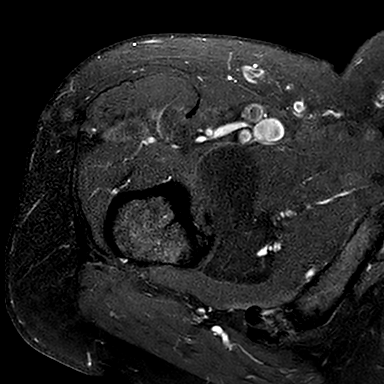
[im 15/29]
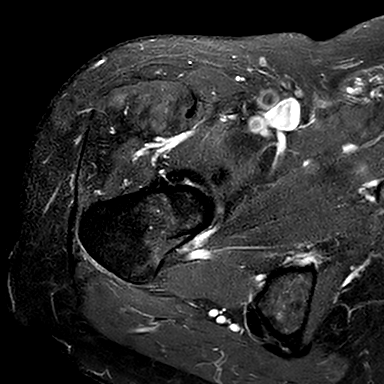
[im 19/29]
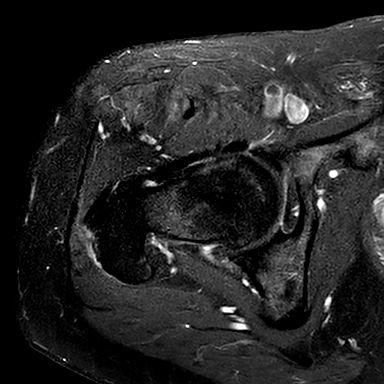

[32 of 40 positions shown; findings below may reference images not displayed]

IMPRESSION: 1.  Lipoma is present within the inferior medial aspect of the right hip/upper 
thigh without abnormal enhancement or nodularity. 
2.  Advanced degenerative changes right hip. 
3.  Low-grade partial tearing of the right abductor cuff.
